# Patient Record
Sex: Female | Born: 1960 | Race: White | Hispanic: No | Marital: Single | Smoking: Current some day smoker
Health system: Southern US, Community
[De-identification: ages and names within clinical notes are randomized; demographics above are authoritative.]

---

## 1999-03-09 ENCOUNTER — Other Ambulatory Visit: Admission: RE | Admit: 1999-03-09 | Discharge: 1999-03-09 | Payer: Self-pay | Admitting: Gynecology

## 2000-04-19 ENCOUNTER — Other Ambulatory Visit: Admission: RE | Admit: 2000-04-19 | Discharge: 2000-04-19 | Payer: Self-pay | Admitting: Gynecology

## 2001-05-02 ENCOUNTER — Other Ambulatory Visit: Admission: RE | Admit: 2001-05-02 | Discharge: 2001-05-02 | Payer: Self-pay | Admitting: Gynecology

## 2002-11-13 ENCOUNTER — Other Ambulatory Visit: Admission: RE | Admit: 2002-11-13 | Discharge: 2002-11-13 | Payer: Self-pay | Admitting: Gynecology

## 2005-10-08 ENCOUNTER — Encounter: Admission: RE | Admit: 2005-10-08 | Discharge: 2005-10-08 | Payer: Self-pay | Admitting: Rheumatology

## 2007-01-18 ENCOUNTER — Encounter: Admission: RE | Admit: 2007-01-18 | Discharge: 2007-01-18 | Payer: Self-pay | Admitting: Orthopedic Surgery

## 2009-03-04 ENCOUNTER — Encounter: Admission: RE | Admit: 2009-03-04 | Discharge: 2009-03-04 | Payer: Self-pay | Admitting: Family Medicine

## 2016-04-16 ENCOUNTER — Other Ambulatory Visit: Payer: Self-pay | Admitting: Family Medicine

## 2016-04-17 ENCOUNTER — Other Ambulatory Visit: Payer: Self-pay | Admitting: Family Medicine

## 2016-04-17 DIAGNOSIS — E041 Nontoxic single thyroid nodule: Secondary | ICD-10-CM

## 2016-04-26 ENCOUNTER — Ambulatory Visit
Admission: RE | Admit: 2016-04-26 | Discharge: 2016-04-26 | Disposition: A | Discharge status: Home / Self Care | Payer: Managed Care, Other (non HMO) | Source: Ambulatory Visit | Attending: Family Medicine | Admitting: Family Medicine

## 2016-04-26 DIAGNOSIS — E041 Nontoxic single thyroid nodule: Secondary | ICD-10-CM

## 2019-05-29 ENCOUNTER — Other Ambulatory Visit: Payer: Self-pay | Admitting: Family Medicine

## 2019-05-29 DIAGNOSIS — Z1231 Encounter for screening mammogram for malignant neoplasm of breast: Secondary | ICD-10-CM

## 2019-05-29 DIAGNOSIS — E559 Vitamin D deficiency, unspecified: Secondary | ICD-10-CM

## 2019-05-29 DIAGNOSIS — M810 Age-related osteoporosis without current pathological fracture: Secondary | ICD-10-CM

## 2020-07-11 ENCOUNTER — Encounter (HOSPITAL_COMMUNITY): Payer: Self-pay | Admitting: Emergency Medicine

## 2020-07-11 ENCOUNTER — Emergency Department (HOSPITAL_COMMUNITY)
Admission: EM | Admit: 2020-07-11 | Discharge: 2020-07-12 | Disposition: A | Discharge status: Home / Self Care | Payer: Managed Care, Other (non HMO) | Attending: Emergency Medicine | Admitting: Emergency Medicine

## 2020-07-11 ENCOUNTER — Emergency Department (HOSPITAL_COMMUNITY): Payer: Managed Care, Other (non HMO)

## 2020-07-11 DIAGNOSIS — Y9389 Activity, other specified: Secondary | ICD-10-CM | POA: Diagnosis not present

## 2020-07-11 DIAGNOSIS — S01511A Laceration without foreign body of lip, initial encounter: Secondary | ICD-10-CM

## 2020-07-11 DIAGNOSIS — W19XXXA Unspecified fall, initial encounter: Secondary | ICD-10-CM

## 2020-07-11 DIAGNOSIS — Z23 Encounter for immunization: Secondary | ICD-10-CM | POA: Diagnosis not present

## 2020-07-11 DIAGNOSIS — Y929 Unspecified place or not applicable: Secondary | ICD-10-CM | POA: Diagnosis not present

## 2020-07-11 DIAGNOSIS — W010XXA Fall on same level from slipping, tripping and stumbling without subsequent striking against object, initial encounter: Secondary | ICD-10-CM | POA: Diagnosis not present

## 2020-07-11 DIAGNOSIS — S2002XA Contusion of left breast, initial encounter: Secondary | ICD-10-CM | POA: Insufficient documentation

## 2020-07-11 DIAGNOSIS — S50311A Abrasion of right elbow, initial encounter: Secondary | ICD-10-CM | POA: Insufficient documentation

## 2020-07-11 DIAGNOSIS — Y999 Unspecified external cause status: Secondary | ICD-10-CM | POA: Insufficient documentation

## 2020-07-11 DIAGNOSIS — R930 Abnormal findings on diagnostic imaging of skull and head, not elsewhere classified: Secondary | ICD-10-CM | POA: Diagnosis not present

## 2020-07-11 DIAGNOSIS — S025XXB Fracture of tooth (traumatic), initial encounter for open fracture: Secondary | ICD-10-CM | POA: Diagnosis not present

## 2020-07-11 DIAGNOSIS — S0993XA Unspecified injury of face, initial encounter: Secondary | ICD-10-CM | POA: Diagnosis present

## 2020-07-11 DIAGNOSIS — S300XXA Contusion of lower back and pelvis, initial encounter: Secondary | ICD-10-CM | POA: Insufficient documentation

## 2020-07-11 LAB — COMPREHENSIVE METABOLIC PANEL
ALT: 107 U/L — ABNORMAL HIGH (ref 0–44)
AST: 239 U/L — ABNORMAL HIGH (ref 15–41)
Albumin: 3.6 g/dL (ref 3.5–5.0)
Alkaline Phosphatase: 92 U/L (ref 38–126)
Anion gap: 12 (ref 5–15)
BUN: 6 mg/dL (ref 6–20)
CO2: 28 mmol/L (ref 22–32)
Calcium: 9.2 mg/dL (ref 8.9–10.3)
Chloride: 106 mmol/L (ref 98–111)
Creatinine, Ser: 0.53 mg/dL (ref 0.44–1.00)
GFR calc Af Amer: >60 mL/min (ref >60)
GFR calc non Af Amer: >60 mL/min (ref >60)
Glucose, Bld: 91 mg/dL (ref 70–99)
Potassium: 3.7 mmol/L (ref 3.5–5.1)
Sodium: 146 mmol/L — ABNORMAL HIGH (ref 135–145)
Total Bilirubin: 0.4 mg/dL (ref 0.3–1.2)
Total Protein: 6.8 g/dL (ref 6.5–8.1)

## 2020-07-11 LAB — CBC
HCT: 38.6 % (ref 36.0–46.0)
Hemoglobin: 12.5 g/dL (ref 12.0–15.0)
MCH: 33.6 pg (ref 26.0–34.0)
MCHC: 32.4 g/dL (ref 30.0–36.0)
MCV: 103.8 fL — ABNORMAL HIGH (ref 80.0–100.0)
Platelets: 220 10*3/uL (ref 150–400)
RBC: 3.72 MIL/uL — ABNORMAL LOW (ref 3.87–5.11)
RDW: 14.4 % (ref 11.5–15.5)
WBC: 4.2 10*3/uL (ref 4.0–10.5)
nRBC: 0 % (ref 0.0–0.2)

## 2020-07-11 LAB — PROTIME-INR
INR: 0.9 (ref 0.8–1.2)
Prothrombin Time: 11.7 seconds (ref 11.4–15.2)

## 2020-07-11 LAB — LACTIC ACID, PLASMA: Lactic Acid, Venous: 2 mmol/L (ref 0.5–1.9)

## 2020-07-11 LAB — ETHANOL: Alcohol, Ethyl (B): 359 mg/dL (ref <10)

## 2020-07-11 MED ORDER — TETANUS-DIPHTH-ACELL PERTUSSIS 5-2.5-18.5 LF-MCG/0.5 IM SUSP
0.5000 mL | Freq: Once | INTRAMUSCULAR | Status: AC
  Administered 2020-07-12: 0.5 mL via INTRAMUSCULAR
  Filled 2020-07-11: qty 0.5

## 2020-07-11 MED ORDER — LIDOCAINE-EPINEPHRINE 1 %-1:100000 IJ SOLN
10.0000 mL | Freq: Once | INTRAMUSCULAR | Status: DC
  Filled 2020-07-11: qty 1

## 2020-07-11 NOTE — ED Notes (Signed)
Pt in CT a this time, husband now at bedside

## 2020-07-11 NOTE — ED Triage Notes (Signed)
Pt transported from home by [facility], pt reports falling forward hitting floor with face causing avulsion to lower lip with Front right tooth broken others loose.  Pt found to have bruising to L breast, and back all in various stages of healing. ccollar in place .

## 2020-07-11 NOTE — ED Provider Notes (Signed)
[facility] EMERGENCY DEPARTMENT Provider Note   CSN: [id number] Arrival date & time: 07/11/20  2111     History Chief Complaint  Patient presents with   Yesenia Phillips is a 59 y.o. female past medical history of hypothyroidism, alcohol abuse presents the ED after mechanical fall at home.  The patient states that she had been drinking and tripped over the threshold, striking her face on the floor.  She denies associated headache but complains of pain to her face alone.  Notable deformity to the lip and teeth.  No interventions prior to arrival.  She reports that she has had several falls recently all in the setting of drinking.  Reports a total of 4 beers today which is typical for her..  The history is provided by the patient.  Fall This is a new problem. The current episode started less than 1 hour ago. The problem occurs every several days. The problem has not changed since onset.Pertinent negatives include no chest pain, no abdominal pain, no headaches and no shortness of breath. Associated symptoms comments: Facial pain. She has tried nothing for the symptoms.       No past medical history on file.  There are no problems to display for this patient.  OB History   No obstetric history on file.     No family history on file.  Social History   Tobacco Use   Smoking status: Not on file  Substance Use Topics   Alcohol use: Not on file   Drug use: Not on file    Home Medications Prior to Admission medications   Not on File    Allergies    Patient has no known allergies.  Review of Systems   Review of Systems  Constitutional: Negative for chills and fever.  HENT: Positive for dental problem and facial swelling. Negative for voice change.   Eyes: Negative for redness and visual disturbance.  Respiratory: Negative for cough and shortness of breath.   Cardiovascular: Negative for chest pain and palpitations.  Gastrointestinal:  Negative for abdominal pain and vomiting.  Genitourinary: Negative for difficulty urinating and dysuria.  Musculoskeletal: Negative for gait problem and joint swelling.  Skin: Positive for wound. Negative for rash.       Positive for bruising throughout.  Neurological: Negative for dizziness and headaches.  Psychiatric/Behavioral: Negative for confusion and suicidal ideas.    Physical Exam Updated Vital Signs BP (!) 149/89 (BP Location: Right Arm)    Pulse (!) 104    Temp 98.2 F (36.8 C) (Oral)    Resp 16    Ht 5\' 4"  (1.626 m)    Wt 47 kg    SpO2 93%    BMI 17.79 kg/m   Physical Exam Constitutional:      General: She is not in acute distress.    Comments: Grossly intoxicated  HENT:     Head: Normocephalic.     Comments: Full-thickness laceration through the lower lip, numerous Ellis class II fractures to the upper and lower incisors, no Ellis class III fractures    Mouth/Throat:     Mouth: Mucous membranes are moist.     Pharynx: Oropharynx is clear.  Eyes:     General: No scleral icterus.    Pupils: Pupils are equal, round, and reactive to light.  Cardiovascular:     Rate and Rhythm: Normal rate and regular rhythm.     Pulses: Normal pulses.  Pulmonary:  Effort: Pulmonary effort is normal. No respiratory distress.  Abdominal:     General: There is no distension.     Tenderness: There is no abdominal tenderness.  Musculoskeletal:        General: No tenderness or deformity.     Cervical back: Normal range of motion and neck supple.  Skin:    Comments: Bruising noted to the left breast, left posterior back, abrasion of the right elbow  Neurological:     General: No focal deficit present.     Mental Status: She is alert and oriented to person, place, and time.  Psychiatric:        Mood and Affect: Mood normal.        Behavior: Behavior normal.     ED Results / Procedures / Treatments   Labs (all labs ordered are listed, but only abnormal results are  displayed) Labs Reviewed  COMPREHENSIVE METABOLIC PANEL - Abnormal; Notable for the following components:      Result Value   Sodium 146 (*)    AST 239 (*)    ALT 107 (*)    All other components within normal limits  CBC - Abnormal; Notable for the following components:   RBC 3.72 (*)    MCV 103.8 (*)    All other components within normal limits  ETHANOL - Abnormal; Notable for the following components:   Alcohol, Ethyl (B) 359 (*)    All other components within normal limits  LACTIC ACID, PLASMA - Abnormal; Notable for the following components:   Lactic Acid, Venous 2.0 (*)    All other components within normal limits  PROTIME-INR  URINALYSIS, ROUTINE W REFLEX MICROSCOPIC  I-STAT CHEM 8, ED  SAMPLE TO BLOOD BANK    EKG None  Radiology DG Chest 1 View  Result Date: 07/11/2020 CLINICAL DATA:  Status post fall. EXAM: CHEST  1 VIEW COMPARISON:  April 16, 2016 FINDINGS: The heart size and mediastinal contours are within normal limits. Both lungs are clear. A chronic ninth left rib fracture deformity is seen. IMPRESSION: No active disease. Electronically Signed   By: Aram Candela M.D.   On: 07/11/2020 22:15   DG Pelvis 1-2 Views  Result Date: 07/11/2020 CLINICAL DATA:  Status post fall. EXAM: PELVIS - 1-2 VIEW COMPARISON:  None. FINDINGS: There is no evidence of pelvic fracture or diastasis. No pelvic bone lesions are seen. IMPRESSION: Negative. Electronically Signed   By: Aram Candela M.D.   On: 07/11/2020 22:13   CT HEAD WO CONTRAST  Result Date: 07/11/2020 CLINICAL DATA:  Fall striking the face causing avulsion to the lower lip and broken and loose teeth. EXAM: CT HEAD WITHOUT CONTRAST CT MAXILLOFACIAL WITHOUT CONTRAST CT CERVICAL SPINE WITHOUT CONTRAST TECHNIQUE: Multidetector CT imaging of the head, cervical spine, and maxillofacial structures were performed using the standard protocol without intravenous contrast. Multiplanar CT image reconstructions of the cervical  spine and maxillofacial structures were also generated. COMPARISON:  None. FINDINGS: CT HEAD FINDINGS Brain: No evidence of acute infarction, hemorrhage, hydrocephalus, extra-axial collection or mass lesion/mass effect. Diffuse cerebral atrophy. 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 Vascular: Intracranial arterial vascular calcifications are present. Skull: The calvarium appears intact. Other: None. CT MAXILLOFACIAL FINDINGS Osseous: The nasal bones, orbital rims, maxillary antral walls, maxilla, mandibles, temporomandibular joints, pterygoid plates, and zygomatic arches appear intact. No acute displaced fractures identified. Orbits: Globes and extraocular muscles appear intact and symmetrical. No periorbital or intraconal hematoma. Sinuses: Paranasal sinuses and mastoid air cells are clear. Soft  tissues: Soft tissue laceration of the upper lip with tiny radiopaque foreign bodies demonstrated. Small associated hematoma. CT CERVICAL SPINE FINDINGS Alignment: Normal alignment the cervical spine and posterior elements. C1-2 articulation appears intact. Skull base and vertebrae: Skull base appears intact. No vertebral compression deformities. No focal bone lesion or bone destruction. Bone cortex appears intact. No acute fractures identified. Soft tissues and spinal canal: No prevertebral soft tissue swelling. No abnormal paraspinal soft tissue mass or infiltration. Disc levels: Degenerative changes with narrowed disc spaces and endplate hypertrophic changes most prominent at C5-6 and C6-7 levels. Degenerative changes in the facet joints. Upper chest: Mild emphysematous changes in the upper lungs. Other: None. IMPRESSION: 1. No acute intracranial abnormalities. Diffuse cerebral atrophy. 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 2. Soft tissue laceration of the upper lip with tiny radiopaque foreign bodies demonstrated. Small associated hematoma. 3. No acute displaced orbital or facial fractures identified. 4. Normal  alignment of the cervical spine. Degenerative changes. No acute displaced fractures identified. Electronically Signed   By: Burman NievesWilliam  Stevens M.D.   On: 07/11/2020 22:10   CT CERVICAL SPINE WO CONTRAST  Result Date: 07/11/2020 CLINICAL DATA:  Fall striking the face causing avulsion to the lower lip and broken and loose teeth. EXAM: CT HEAD WITHOUT CONTRAST CT MAXILLOFACIAL WITHOUT CONTRAST CT CERVICAL SPINE WITHOUT CONTRAST TECHNIQUE: Multidetector CT imaging of the head, cervical spine, and maxillofacial structures were performed using the standard protocol without intravenous contrast. Multiplanar CT image reconstructions of the cervical spine and maxillofacial structures were also generated. COMPARISON:  None. FINDINGS: CT HEAD FINDINGS Brain: No evidence of acute infarction, hemorrhage, hydrocephalus, extra-axial collection or mass lesion/mass effect. Diffuse cerebral atrophy. 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 Vascular: Intracranial arterial vascular calcifications are present. Skull: The calvarium appears intact. Other: None. CT MAXILLOFACIAL FINDINGS Osseous: The nasal bones, orbital rims, maxillary antral walls, maxilla, mandibles, temporomandibular joints, pterygoid plates, and zygomatic arches appear intact. No acute displaced fractures identified. Orbits: Globes and extraocular muscles appear intact and symmetrical. No periorbital or intraconal hematoma. Sinuses: Paranasal sinuses and mastoid air cells are clear. Soft tissues: Soft tissue laceration of the upper lip with tiny radiopaque foreign bodies demonstrated. Small associated hematoma. CT CERVICAL SPINE FINDINGS Alignment: Normal alignment the cervical spine and posterior elements. C1-2 articulation appears intact. Skull base and vertebrae: Skull base appears intact. No vertebral compression deformities. No focal bone lesion or bone destruction. Bone cortex appears intact. No acute fractures identified. Soft tissues and spinal canal: No  prevertebral soft tissue swelling. No abnormal paraspinal soft tissue mass or infiltration. Disc levels: Degenerative changes with narrowed disc spaces and endplate hypertrophic changes most prominent at C5-6 and C6-7 levels. Degenerative changes in the facet joints. Upper chest: Mild emphysematous changes in the upper lungs. Other: None. IMPRESSION: 1. No acute intracranial abnormalities. Diffuse cerebral atrophy. 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 2. Soft tissue laceration of the upper lip with tiny radiopaque foreign bodies demonstrated. Small associated hematoma. 3. No acute displaced orbital or facial fractures identified. 4. Normal alignment of the cervical spine. Degenerative changes. No acute displaced fractures identified. Electronically Signed   By: Burman NievesWilliam  Stevens M.D.   On: 07/11/2020 22:10   CT MAXILLOFACIAL WO CONTRAST  Result Date: 07/11/2020 CLINICAL DATA:  Fall striking the face causing avulsion to the lower lip and broken and loose teeth. EXAM: CT HEAD WITHOUT CONTRAST CT MAXILLOFACIAL WITHOUT CONTRAST CT CERVICAL SPINE WITHOUT CONTRAST TECHNIQUE: Multidetector CT imaging of the head, cervical spine, and maxillofacial structures were performed  using the standard protocol without intravenous contrast. Multiplanar CT image reconstructions of the cervical spine and maxillofacial structures were also generated. COMPARISON:  None. FINDINGS: CT HEAD FINDINGS Brain: No evidence of acute infarction, hemorrhage, hydrocephalus, extra-axial collection or mass lesion/mass effect. Diffuse cerebral atrophy. 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 Vascular: Intracranial arterial vascular calcifications are present. Skull: The calvarium appears intact. Other: None. CT MAXILLOFACIAL FINDINGS Osseous: The nasal bones, orbital rims, maxillary antral walls, maxilla, mandibles, temporomandibular joints, pterygoid plates, and zygomatic arches appear intact. No acute displaced fractures identified. Orbits: Globes  and extraocular muscles appear intact and symmetrical. No periorbital or intraconal hematoma. Sinuses: Paranasal sinuses and mastoid air cells are clear. Soft tissues: Soft tissue laceration of the upper lip with tiny radiopaque foreign bodies demonstrated. Small associated hematoma. CT CERVICAL SPINE FINDINGS Alignment: Normal alignment the cervical spine and posterior elements. C1-2 articulation appears intact. Skull base and vertebrae: Skull base appears intact. No vertebral compression deformities. No focal bone lesion or bone destruction. Bone cortex appears intact. No acute fractures identified. Soft tissues and spinal canal: No prevertebral soft tissue swelling. No abnormal paraspinal soft tissue mass or infiltration. Disc levels: Degenerative changes with narrowed disc spaces and endplate hypertrophic changes most prominent at C5-6 and C6-7 levels. Degenerative changes in the facet joints. Upper chest: Mild emphysematous changes in the upper lungs. Other: None. IMPRESSION: 1. No acute intracranial abnormalities. Diffuse cerebral atrophy. 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 2. Soft tissue laceration of the upper lip with tiny radiopaque foreign bodies demonstrated. Small associated hematoma. 3. No acute displaced orbital or facial fractures identified. 4. Normal alignment of the cervical spine. Degenerative changes. No acute displaced fractures identified. Electronically Signed   By: Burman Nieves M.D.   On: 07/11/2020 22:10    Procedures .Marland KitchenLaceration Repair  Date/Time: 07/11/2020 11:27 PM Performed by: Loree Fee, MD Authorized by: Jacalyn Lefevre, MD   Consent:    Consent obtained:  Verbal   Consent given by:  Patient (Fiance)   Risks discussed:  Infection, need for additional repair, nerve damage, pain, poor cosmetic result, poor wound healing, retained foreign body, tendon damage and vascular damage   Alternatives discussed:  No treatment Anesthesia (see MAR for exact dosages):     Anesthesia method:  Local infiltration   Local anesthetic:  Lidocaine 2% WITH epi Laceration details:    Location:  Lip   Lip location:  Lower lip, full thickness   Vermilion border involved: no     Height of lip laceration:  More than half vertical height   Length (cm):  3 Repair type:    Repair type:  Intermediate Exploration:    Hemostasis achieved with:  Epinephrine   Wound extent: muscle damage     Contaminated: yes   Treatment:    Irrigation solution:  Sterile saline   Irrigation volume:  200   Irrigation method:  Syringe   Visualized foreign bodies/material removed: no (Thoroughly irrigated and explored with no evident FB)   Skin repair:    Repair method:  Sutures   Suture size:  4-0   Suture material:  Plain gut (Vicryl)   Suture technique:  Horizontal mattress and simple interrupted (1 single deep Vicryl suture for reapproximation of the muscle layer, 5 individual simple interrupted sutures for wound edge approximation and one horizontal mattress on the mucosal surface)   Number of sutures:  6 Approximation:    Approximation:  Close Post-procedure details:    Dressing:  Open (no dressing)   (including  critical care time)  Medications Ordered in ED Medications  Tdap (BOOSTRIX) injection 0.5 mL (has no administration in time range)  lidocaine-EPINEPHrine (XYLOCAINE W/EPI) 1 %-1:100000 (with pres) injection 10 mL (has no administration in time range)    ED Course  I have reviewed the triage vital signs and the nursing notes.  Pertinent labs & imaging results that were available during my care of the patient were reviewed by me and considered in my medical decision making (see chart for details).    MDM Rules/Calculators/A&P                          Possible differential diagnoses I considered included:  ICH, skull fracture, concussion, C-spine injury, thoracic trauma, abdominal trauma, spinal injury, fracture, neurovascular injury,  laceration  Workup/Thought Process:  Patient presents following mechanical fall in the setting of heavy alcohol use, presenting grossly intoxicated with obvious facial trauma  Primary survey performed with findings above  Secondary survey performed with findings above  Vitals, labs, EKGs, and imaging were reviewed by myself, and were significant for: Vitals:   07/11/20 2113 07/11/20 2120  BP:  (!) 149/89  Pulse:  (!) 104  Resp:  16  Temp:  98.2 F (36.8 C)  SpO2: 99% 93%    Trauma scans including CT head, CT face, CT C-spine indicated given poor history in the setting of intoxication and inability clear C-spine, significant for: No ICH or clinically significant head, face injury.  C-spine clear.  Cervical collar cleared with negative imaging and no tenderness to the cervical midline.  On thorough MSK exam, no areas of focal tenderness or deformity requiring plain films  Trauma labs including CBC, CMP, urinalysis, lactic acid, ethanol level indicated. Findings include: Slight lactic acidosis likely in the setting of alcohol use and traumatic injury, no suspicion for antibiotics at this time.  Ethanol level elevated at 360.  Tdap administered.  Laceration repaired as above  Patient's fianc at bedside is sober and able to provide safe transport home, expresses understanding with importance of follow-up with dentistry for dental fractures as soon as possible, counseled on resolvable nature of sutures but need for wound evaluation approximately a week.  Counseled on wound care.  At this time I feel patient stable for discharge home in the care of her fianc.  The plan for this patient was discussed with Dr. Particia Nearing who voiced agreement and who oversaw evaluation and treatment of this patient.   Final Clinical Impression(s) / ED Diagnoses Final diagnoses:  Fall  Open fracture of tooth, initial encounter  Lip laceration, initial encounter    Rx / DC Orders ED Discharge  Orders    None     Labs, studies and imaging reviewed by myself and considered in medical decision making if ordered. Imaging interpreted by radiology. Pt was discussed with my attending, Dr. Particia Nearing  Electronically signed by:  Christiane Ha Redding9/10/202111:27 PM       Loree Fee, MD 07/11/20 1610    Jacalyn Lefevre, MD 07/12/20 1507

## 2020-07-12 NOTE — ED Notes (Signed)
Discharge instructions discussed with pt. Pt verbalized understanding with no question at this time. Pt ambulatory at discharge

## 2020-07-18 ENCOUNTER — Ambulatory Visit: Payer: Self-pay | Admitting: *Deleted

## 2020-07-18 NOTE — Telephone Encounter (Signed)
Patient called agent to schedule appointment for follow up to ED visit at [facility]. Patient had fall and got stitches in her lip.Patient states her lip stitches are gone and she feels the cut is opened and infected. Patient disconnected call- attempted to call home and cell number- home# disconnected and cell number- VM full. Unable to reach patient.

## 2020-09-10 ENCOUNTER — Other Ambulatory Visit: Payer: Self-pay | Admitting: Family Medicine

## 2020-09-10 DIAGNOSIS — M810 Age-related osteoporosis without current pathological fracture: Secondary | ICD-10-CM

## 2021-11-15 IMAGING — DX DG CHEST 1V
1 series · 1 of 1 positions shown · non-contrast
Comparison: April 16, 2016

CLINICAL DATA: Status post fall.

EXAM:
CHEST  1 VIEW

[chest ap]
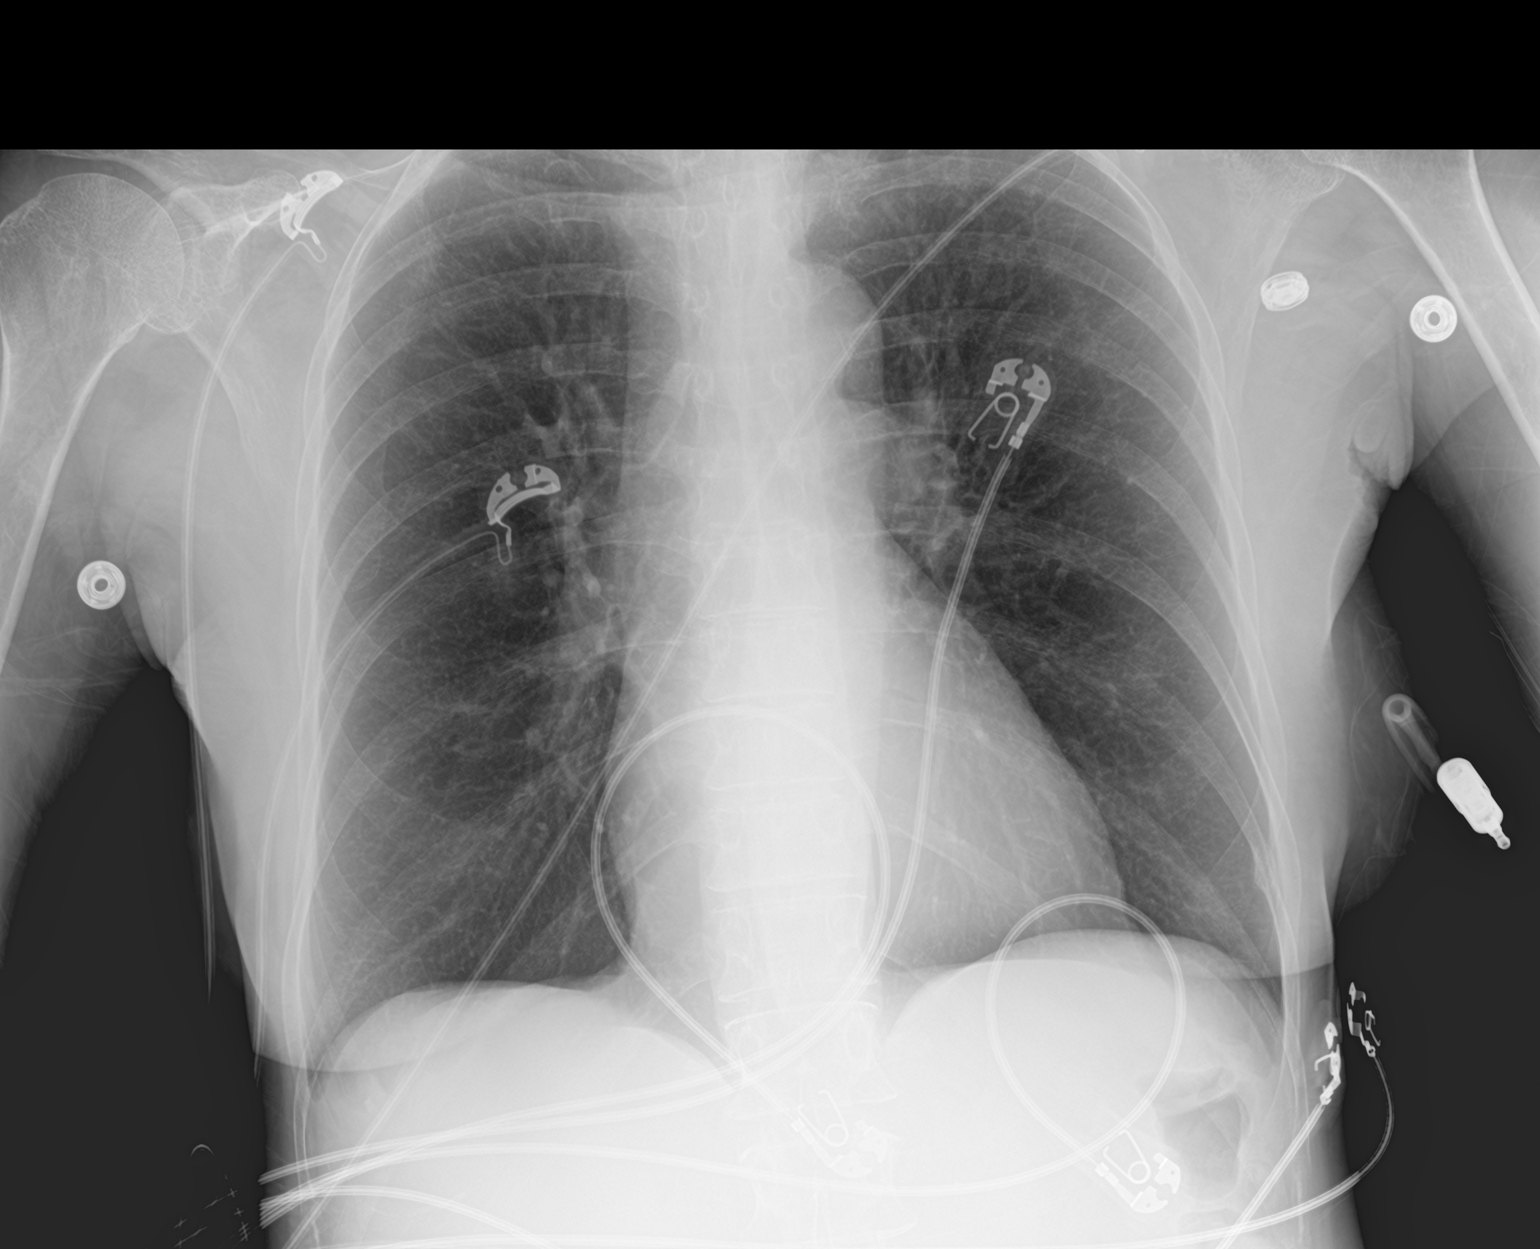

[1 of 1 positions shown; findings below may reference images not displayed]

FINDINGS: The heart size and mediastinal contours are within normal limits.
Both lungs are clear. A chronic ninth left rib fracture deformity is
seen.
IMPRESSION: No active disease.

## 2021-11-15 IMAGING — CT CT CERVICAL SPINE W/O CM
3 of 4 series · 13 of 33 positions shown, 16 images · non-contrast
Comparison: None.

CLINICAL DATA: Fall striking the face causing avulsion to the lower
lip and broken and loose teeth.

EXAM:
CT HEAD WITHOUT CONTRAST
CT MAXILLOFACIAL WITHOUT CONTRAST
CT CERVICAL SPINE WITHOUT CONTRAST
TECHNIQUE: Multidetector CT imaging of the head, cervical spine, and
maxillofacial structures were performed using the standard protocol
without intravenous contrast. Multiplanar CT image reconstructions
of the cervical spine and maxillofacial structures were also
generated.

[Series 8: sag bone · sagittal · 0.44mm/px · 5 of 74 slices shown, 6 images]
[im 25/74  bone]
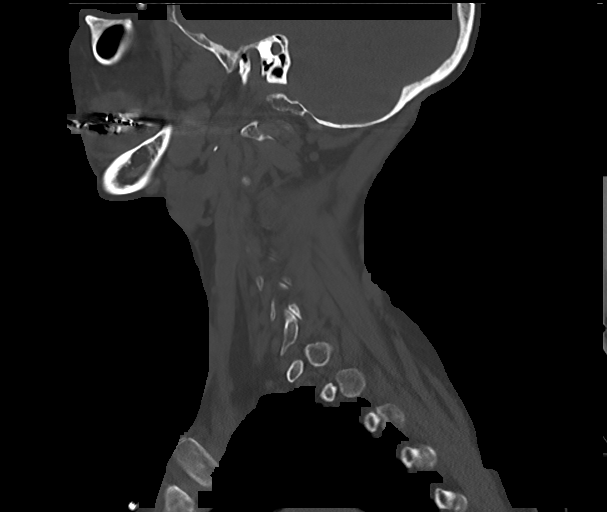
[im 31/74  bone]
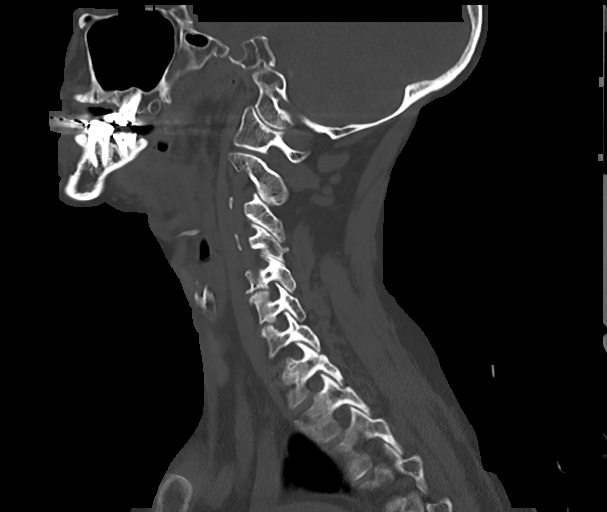
[im 37/74  soft-tissue]
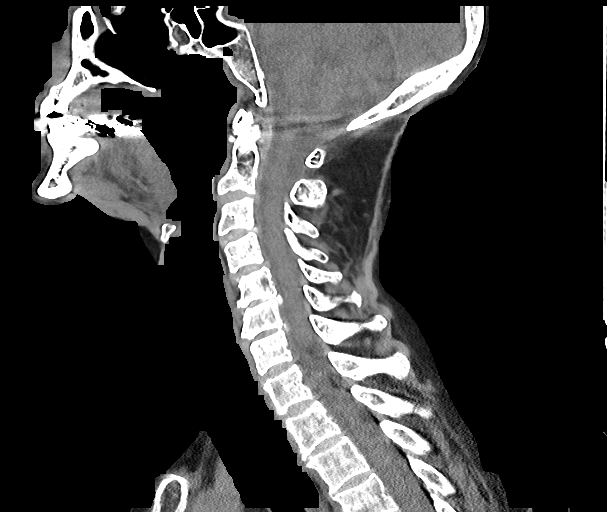
[im 37/74  bone]
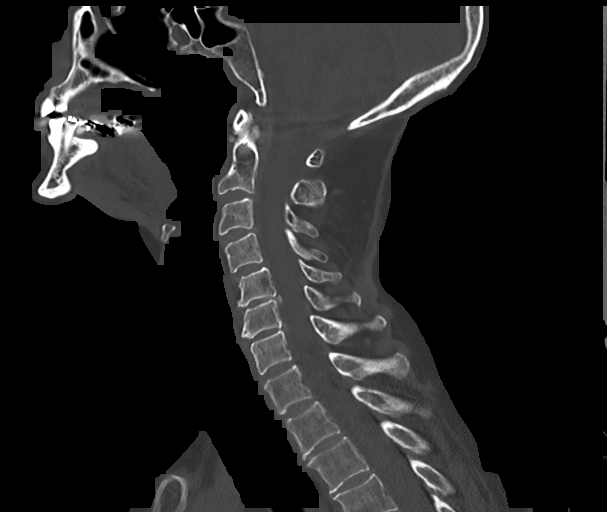
[im 43/74  bone]
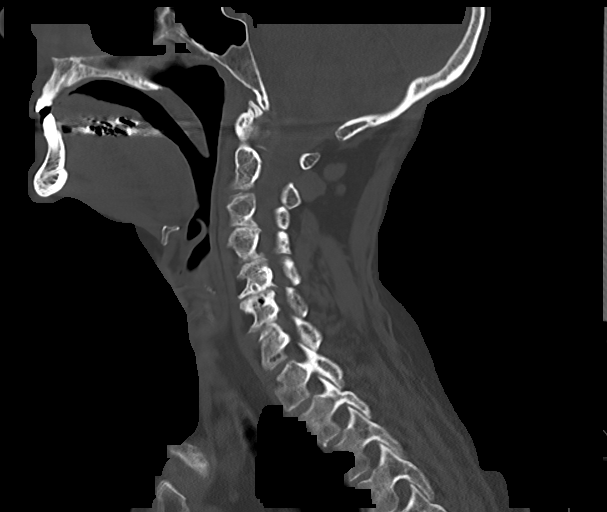
[im 49/74  bone]
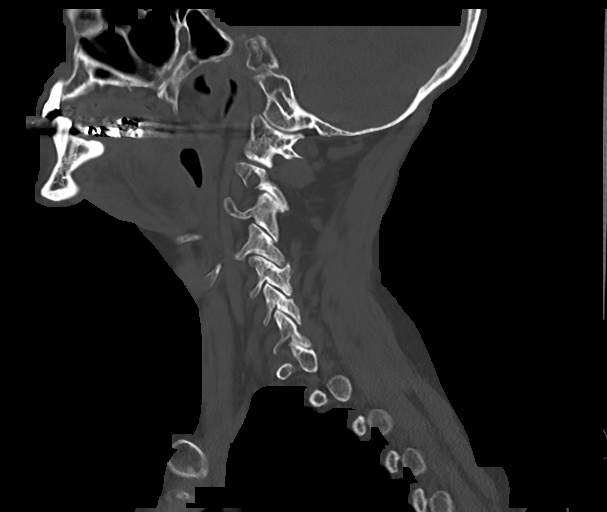

[Series 9: cor bone · coronal · 0.40mm/px · 3 of 134 slices shown]
[im 27/134  bone]
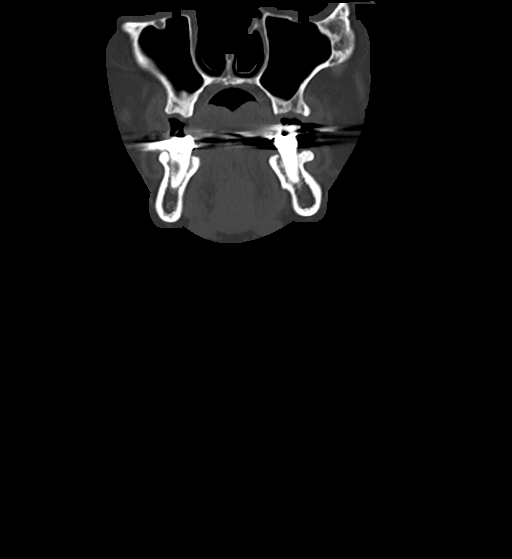
[im 54/134  bone]
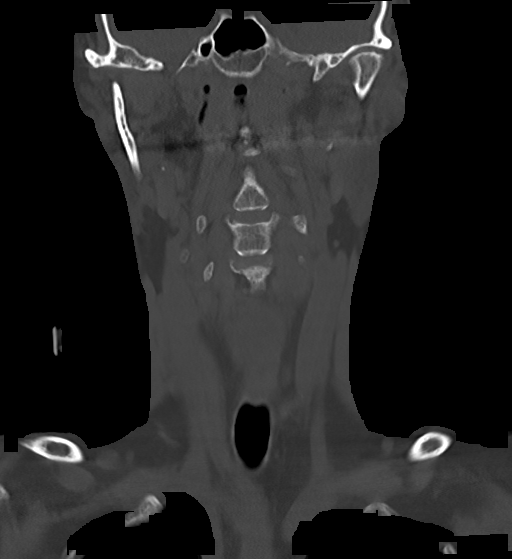
[im 80/134  bone]
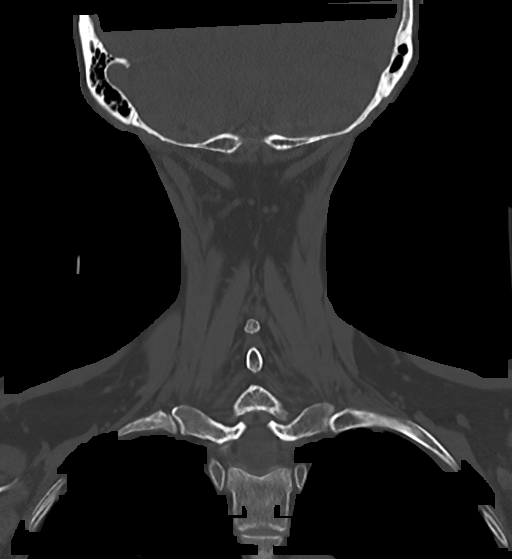

[Series 10: orthogonal axials · axial · 0.21mm/px · z∈[-274,-127]mm · 5 of 109 slices shown, 7 images]
[im 19/109  soft-tissue]
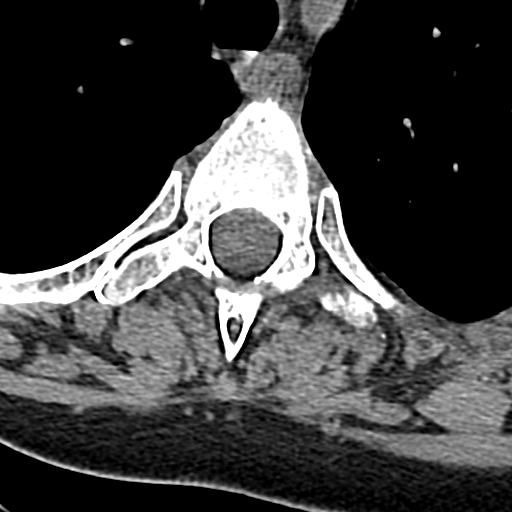
[im 19/109  bone]
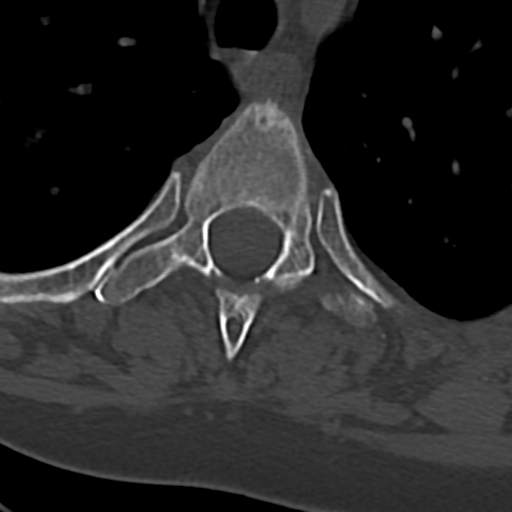
[im 37/109  bone]
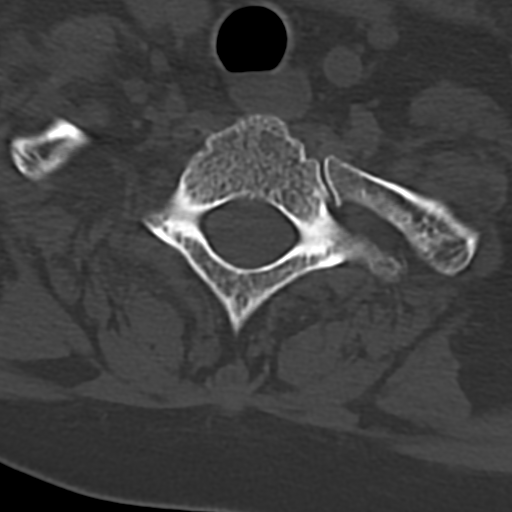
[im 55/109  bone]
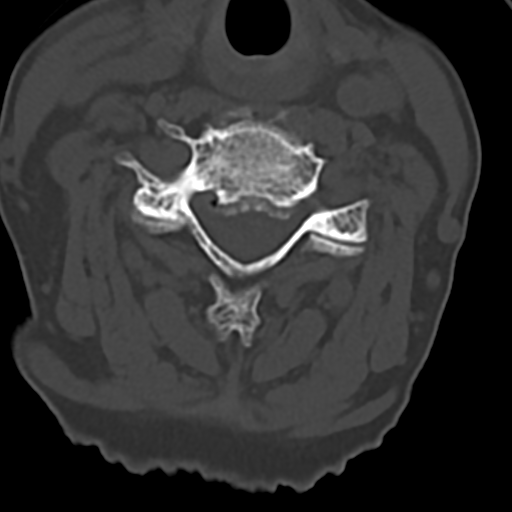
[im 73/109  bone]
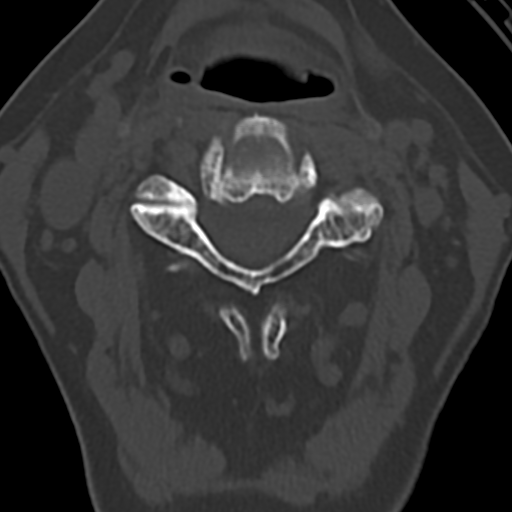
[im 91/109  soft-tissue]
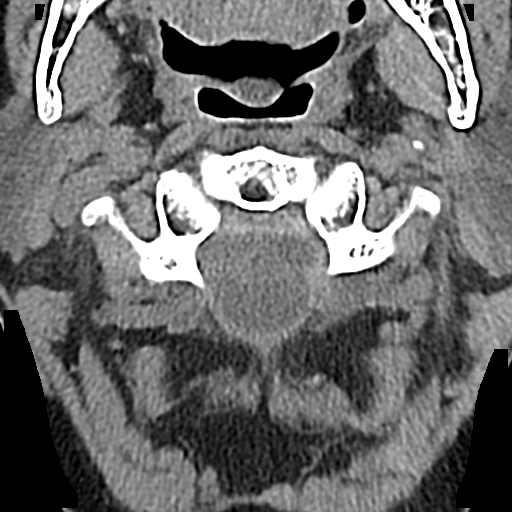
[im 91/109  bone]
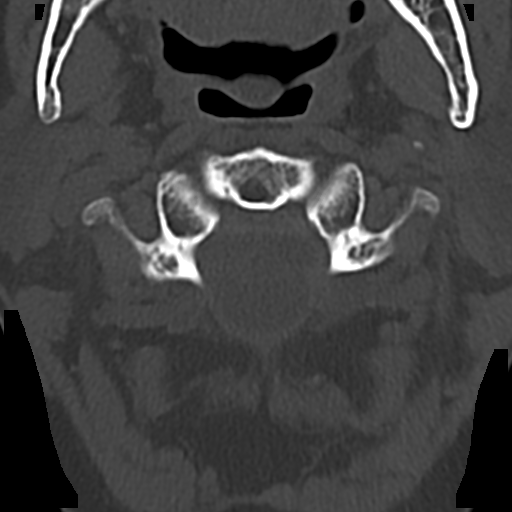

[13 of 33 positions shown; findings below may reference images not displayed]

FINDINGS: CT HEAD FINDINGS

Brain: No evidence of acute infarction, hemorrhage, hydrocephalus,
extra-axial collection or mass lesion/mass effect. Diffuse cerebral
atrophy. Old lacunar infarcts in the thalamus.

Vascular: Intracranial arterial vascular calcifications are present.

Skull: The calvarium appears intact.

Other: None.

CT MAXILLOFACIAL FINDINGS

Osseous: The nasal bones, orbital rims, maxillary antral walls,
maxilla, mandibles, temporomandibular joints, pterygoid plates, and
zygomatic arches appear intact. No acute displaced fractures
identified.

Orbits: Globes and extraocular muscles appear intact and
symmetrical. No periorbital or intraconal hematoma.

Sinuses: Paranasal sinuses and mastoid air cells are clear.

Soft tissues: Soft tissue laceration of the upper lip with tiny
radiopaque foreign bodies demonstrated. Small associated hematoma.

CT CERVICAL SPINE FINDINGS

Alignment: Normal alignment the cervical spine and posterior
elements. C1-2 articulation appears intact.

Skull base and vertebrae: Skull base appears intact. No vertebral
compression deformities. No focal bone lesion or bone destruction.
Bone cortex appears intact. No acute fractures identified.

Soft tissues and spinal canal: No prevertebral soft tissue swelling.
No abnormal paraspinal soft tissue mass or infiltration.

Disc levels: Degenerative changes with narrowed disc spaces and
endplate hypertrophic changes most prominent at C5-6 and C6-7
levels. Degenerative changes in the facet joints.

Upper chest: Mild emphysematous changes in the upper lungs.

Other: None.
IMPRESSION: 1. No acute intracranial abnormalities. Diffuse cerebral atrophy.
Old lacunar infarcts in the thalamus.
2. Soft tissue laceration of the upper lip with tiny radiopaque
foreign bodies demonstrated. Small associated hematoma.
3. No acute displaced orbital or facial fractures identified.
4. Normal alignment of the cervical spine. Degenerative changes. No
acute displaced fractures identified.

## 2021-11-15 IMAGING — CT CT HEAD W/O CM
4 series · 15 of 47 positions shown, 17 images · non-contrast
Comparison: None.

CLINICAL DATA: Fall striking the face causing avulsion to the lower
lip and broken and loose teeth.

EXAM:
CT HEAD WITHOUT CONTRAST
CT MAXILLOFACIAL WITHOUT CONTRAST
CT CERVICAL SPINE WITHOUT CONTRAST
TECHNIQUE: Multidetector CT imaging of the head, cervical spine, and
maxillofacial structures were performed using the standard protocol
without intravenous contrast. Multiplanar CT image reconstructions
of the cervical spine and maxillofacial structures were also
generated.

[Series 3: head wo · axial · 0.44mm/px · z∈[-104,+16]mm · 7 of 34 slices shown, 9 images]
[im 5/34  brain]
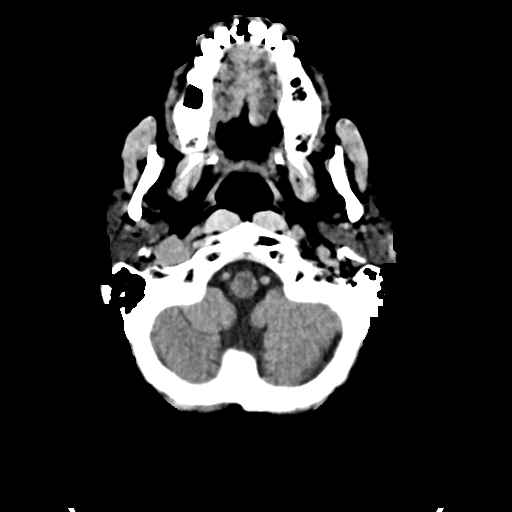
[im 5/34  bone]
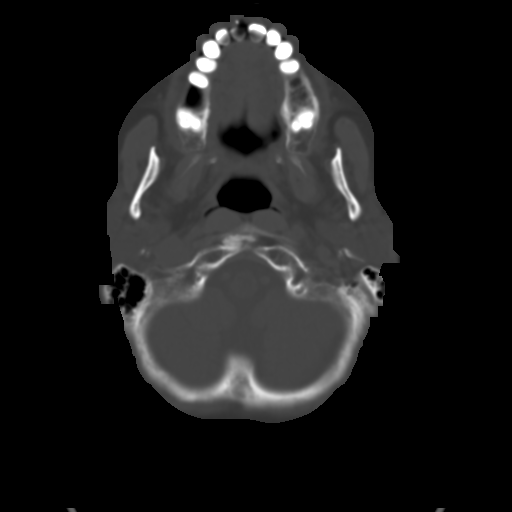
[im 9/34  brain]
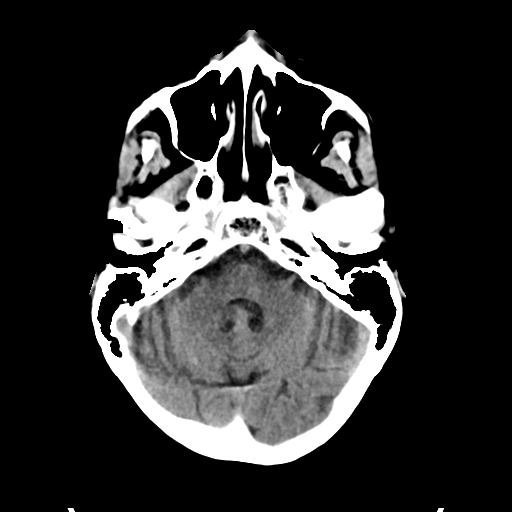
[im 13/34  brain]
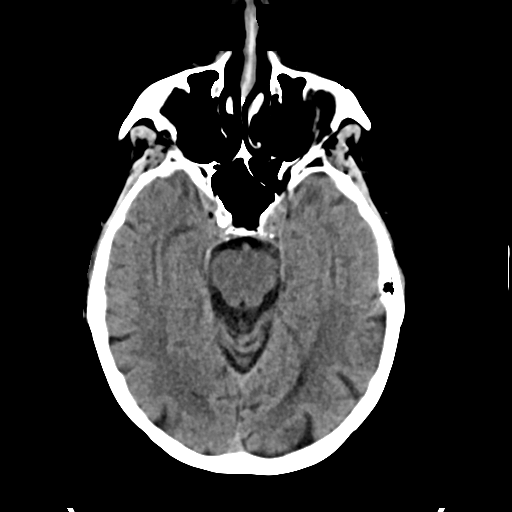
[im 17/34  brain]
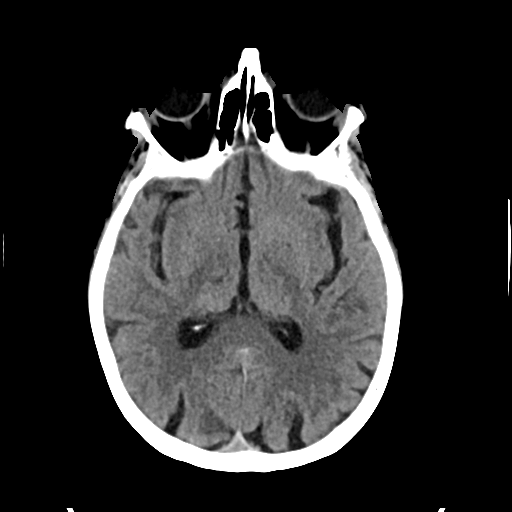
[im 21/34  brain]
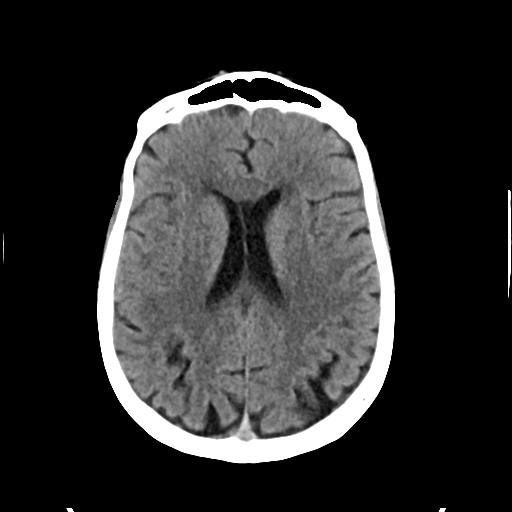
[im 21/34  bone]
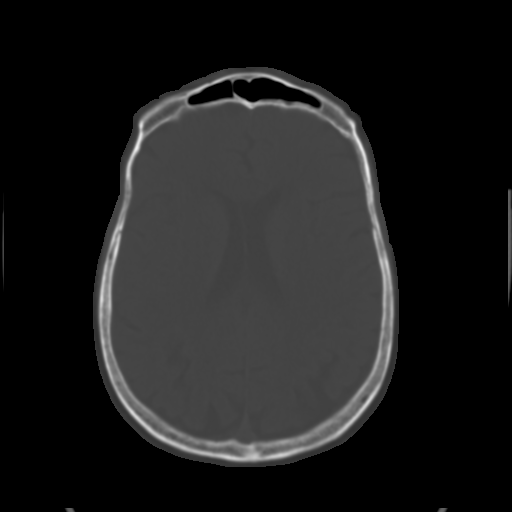
[im 25/34  brain]
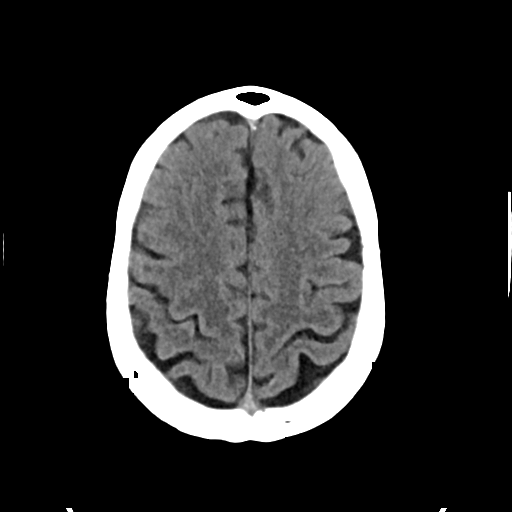
[im 29/34  brain]
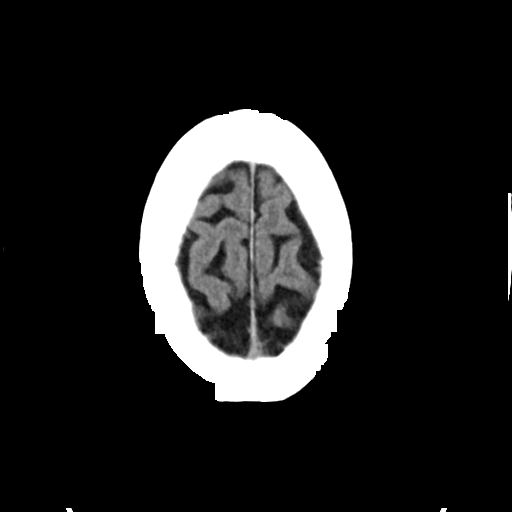

[Series 4: head bone · axial · 0.44mm/px · z∈[-108,-92]mm · 2 of 85 slices shown]
[im 9/85  bone]
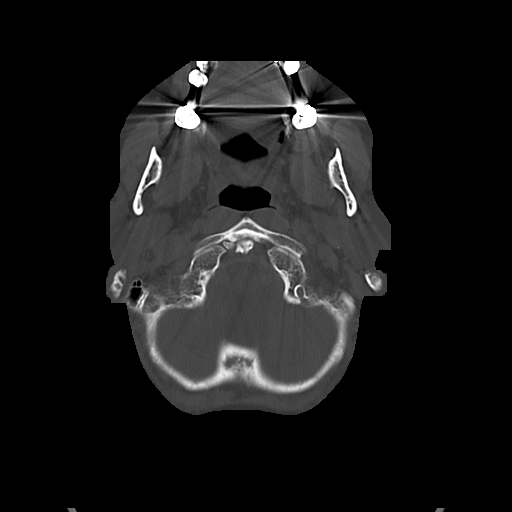
[im 17/85  bone]
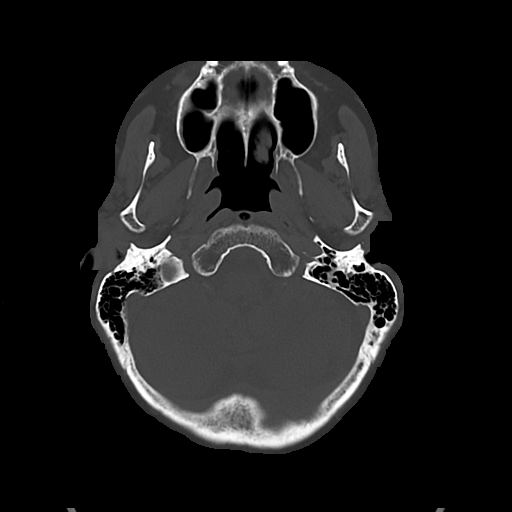

[Series 5: cor soft · coronal · 0.37mm/px · 3 of 76 slices shown]
[im 26/76  brain]
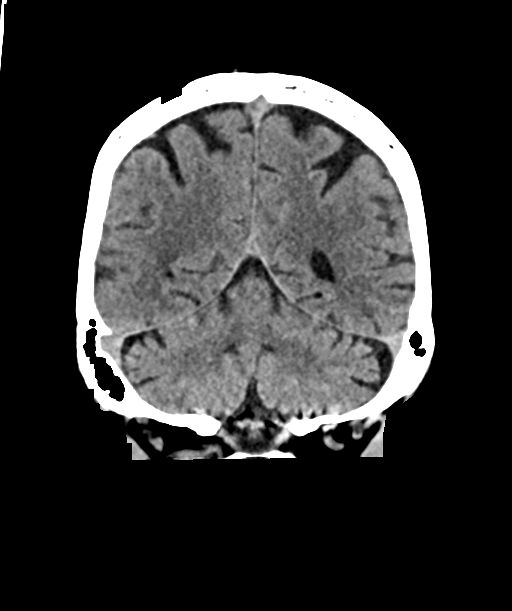
[im 34/76  brain]
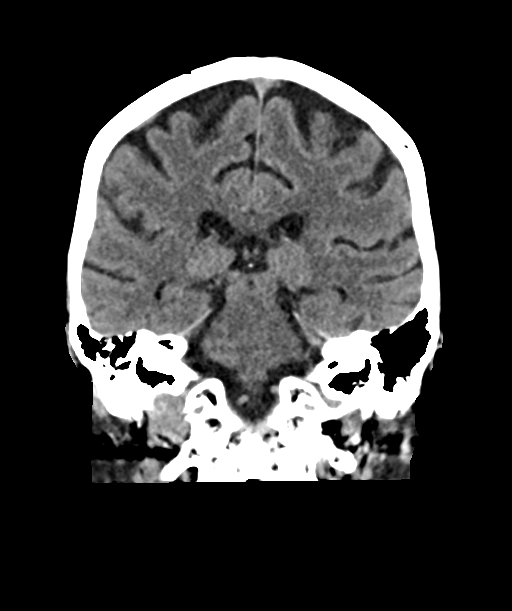
[im 42/76  brain]
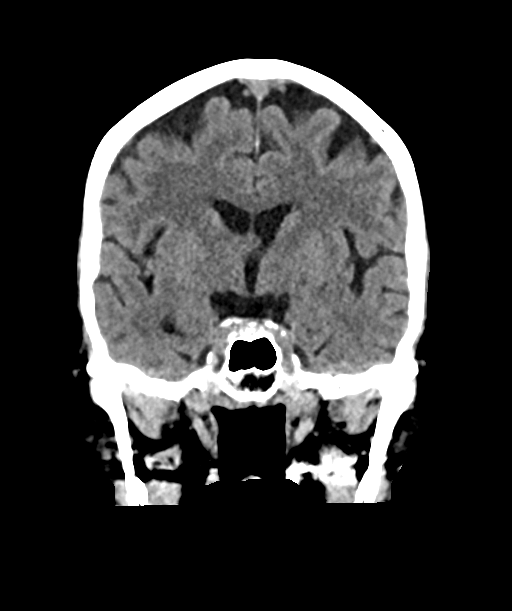

[Series 6: sag soft · sagittal · 0.44mm/px · 3 of 55 slices shown]
[im 19/55  brain]
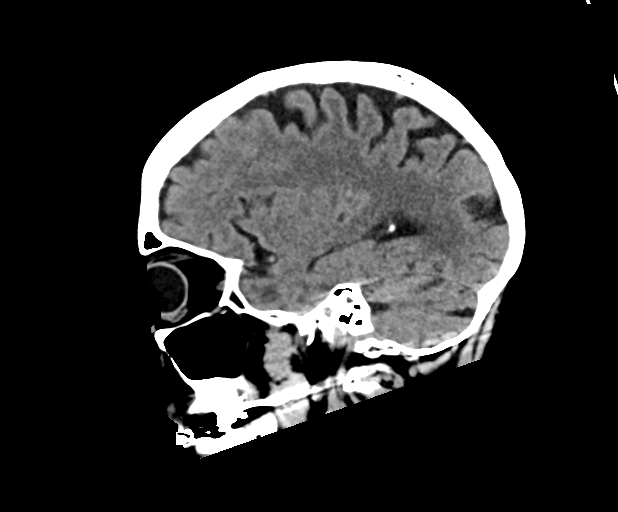
[im 28/55  brain]
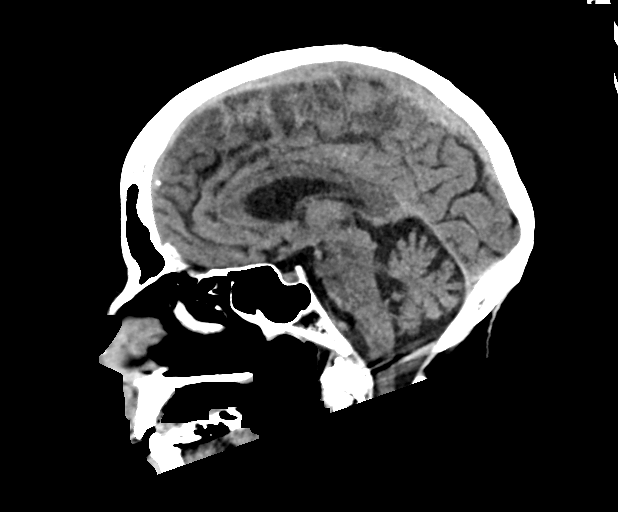
[im 37/55  brain]
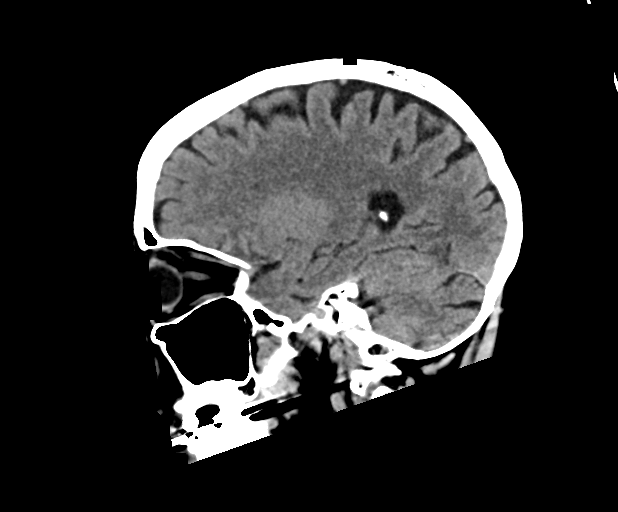

[15 of 47 positions shown; findings below may reference images not displayed]

FINDINGS: CT HEAD FINDINGS

Brain: No evidence of acute infarction, hemorrhage, hydrocephalus,
extra-axial collection or mass lesion/mass effect. Diffuse cerebral
atrophy. Old lacunar infarcts in the thalamus.

Vascular: Intracranial arterial vascular calcifications are present.

Skull: The calvarium appears intact.

Other: None.

CT MAXILLOFACIAL FINDINGS

Osseous: The nasal bones, orbital rims, maxillary antral walls,
maxilla, mandibles, temporomandibular joints, pterygoid plates, and
zygomatic arches appear intact. No acute displaced fractures
identified.

Orbits: Globes and extraocular muscles appear intact and
symmetrical. No periorbital or intraconal hematoma.

Sinuses: Paranasal sinuses and mastoid air cells are clear.

Soft tissues: Soft tissue laceration of the upper lip with tiny
radiopaque foreign bodies demonstrated. Small associated hematoma.

CT CERVICAL SPINE FINDINGS

Alignment: Normal alignment the cervical spine and posterior
elements. C1-2 articulation appears intact.

Skull base and vertebrae: Skull base appears intact. No vertebral
compression deformities. No focal bone lesion or bone destruction.
Bone cortex appears intact. No acute fractures identified.

Soft tissues and spinal canal: No prevertebral soft tissue swelling.
No abnormal paraspinal soft tissue mass or infiltration.

Disc levels: Degenerative changes with narrowed disc spaces and
endplate hypertrophic changes most prominent at C5-6 and C6-7
levels. Degenerative changes in the facet joints.

Upper chest: Mild emphysematous changes in the upper lungs.

Other: None.
IMPRESSION: 1. No acute intracranial abnormalities. Diffuse cerebral atrophy.
Old lacunar infarcts in the thalamus.
2. Soft tissue laceration of the upper lip with tiny radiopaque
foreign bodies demonstrated. Small associated hematoma.
3. No acute displaced orbital or facial fractures identified.
4. Normal alignment of the cervical spine. Degenerative changes. No
acute displaced fractures identified.

## 2023-07-26 DIAGNOSIS — M85859 Other specified disorders of bone density and structure, unspecified thigh: Secondary | ICD-10-CM | POA: Diagnosis not present

## 2023-07-26 DIAGNOSIS — E7800 Pure hypercholesterolemia, unspecified: Secondary | ICD-10-CM | POA: Diagnosis not present

## 2023-07-26 DIAGNOSIS — Z862 Personal history of diseases of the blood and blood-forming organs and certain disorders involving the immune mechanism: Secondary | ICD-10-CM | POA: Diagnosis not present

## 2023-07-27 ENCOUNTER — Other Ambulatory Visit: Payer: Self-pay | Admitting: Family Medicine

## 2023-07-27 DIAGNOSIS — F17200 Nicotine dependence, unspecified, uncomplicated: Secondary | ICD-10-CM

## 2023-07-29 ENCOUNTER — Other Ambulatory Visit: Payer: Self-pay | Admitting: Family Medicine

## 2023-07-29 DIAGNOSIS — M810 Age-related osteoporosis without current pathological fracture: Secondary | ICD-10-CM

## 2023-08-10 DIAGNOSIS — Z1211 Encounter for screening for malignant neoplasm of colon: Secondary | ICD-10-CM | POA: Diagnosis not present

## 2023-08-10 DIAGNOSIS — Z1212 Encounter for screening for malignant neoplasm of rectum: Secondary | ICD-10-CM | POA: Diagnosis not present

## 2023-11-01 DIAGNOSIS — I2510 Atherosclerotic heart disease of native coronary artery without angina pectoris: Secondary | ICD-10-CM | POA: Diagnosis not present

## 2023-11-01 DIAGNOSIS — R9082 White matter disease, unspecified: Secondary | ICD-10-CM | POA: Diagnosis not present

## 2023-11-01 DIAGNOSIS — R29715 NIHSS score 15: Secondary | ICD-10-CM | POA: Diagnosis not present

## 2023-11-01 DIAGNOSIS — R2681 Unsteadiness on feet: Secondary | ICD-10-CM | POA: Diagnosis not present

## 2023-11-01 DIAGNOSIS — I639 Cerebral infarction, unspecified: Secondary | ICD-10-CM | POA: Diagnosis not present

## 2023-11-01 DIAGNOSIS — C3490 Malignant neoplasm of unspecified part of unspecified bronchus or lung: Secondary | ICD-10-CM | POA: Diagnosis not present

## 2023-11-01 DIAGNOSIS — J439 Emphysema, unspecified: Secondary | ICD-10-CM | POA: Diagnosis not present

## 2023-11-02 DIAGNOSIS — J439 Emphysema, unspecified: Secondary | ICD-10-CM | POA: Diagnosis not present

## 2023-11-02 DIAGNOSIS — I2510 Atherosclerotic heart disease of native coronary artery without angina pectoris: Secondary | ICD-10-CM | POA: Diagnosis not present

## 2023-11-02 DIAGNOSIS — C3490 Malignant neoplasm of unspecified part of unspecified bronchus or lung: Secondary | ICD-10-CM | POA: Diagnosis not present

## 2023-11-15 DIAGNOSIS — F17200 Nicotine dependence, unspecified, uncomplicated: Secondary | ICD-10-CM | POA: Diagnosis not present

## 2023-11-15 DIAGNOSIS — J0100 Acute maxillary sinusitis, unspecified: Secondary | ICD-10-CM | POA: Diagnosis not present

## 2023-11-23 DIAGNOSIS — J984 Other disorders of lung: Secondary | ICD-10-CM | POA: Diagnosis not present

## 2023-11-23 DIAGNOSIS — I639 Cerebral infarction, unspecified: Secondary | ICD-10-CM | POA: Diagnosis not present

## 2023-11-23 DIAGNOSIS — F17200 Nicotine dependence, unspecified, uncomplicated: Secondary | ICD-10-CM | POA: Diagnosis not present

## 2023-11-24 ENCOUNTER — Other Ambulatory Visit: Payer: Self-pay | Admitting: Family Medicine

## 2023-11-24 DIAGNOSIS — J984 Other disorders of lung: Secondary | ICD-10-CM

## 2023-12-07 ENCOUNTER — Other Ambulatory Visit: Payer: Self-pay | Admitting: Emergency Medicine

## 2023-12-07 ENCOUNTER — Telehealth: Payer: Self-pay | Admitting: Acute Care

## 2023-12-07 DIAGNOSIS — F17210 Nicotine dependence, cigarettes, uncomplicated: Secondary | ICD-10-CM

## 2023-12-07 DIAGNOSIS — Z87891 Personal history of nicotine dependence: Secondary | ICD-10-CM

## 2023-12-07 DIAGNOSIS — Z122 Encounter for screening for malignant neoplasm of respiratory organs: Secondary | ICD-10-CM

## 2023-12-07 NOTE — Telephone Encounter (Signed)
 Lung Cancer Screening Narrative/Criteria Questionnaire (Cigarette Smokers Only- No Cigars/Pipes/vapes)   Yesenia Phillips   SDMV:12/21/2023 at 8:45am with [facility]        11/09/1960   LDCT: 12/22/2023 at 9:30 at [facility]    63 y.o.   Phone: [phone]  Lung Screening Narrative (confirm age 29-77 yrs Medicare / 50-80 yrs Private pay insurance)   Insurance information:[employer]   Referring Provider:Courtney Katina - PCP   This screening involves an initial phone call with a team member from our program. It is called a shared decision making visit. The initial meeting is required by  insurance and Medicare to make sure you understand the program. This appointment takes about 15-20 minutes to complete. You will complete the screening scan at your scheduled date/time.  This scan takes about 5-10 minutes to complete. You can eat and drink normally before and after the scan.  Criteria questions for Lung Cancer Screening:   Are you a current or former smoker? Current Age began smoking: 63yo   If you are a former smoker, what year did you quit smoking? N/A(within 15 yrs)   To calculate your smoking history, I need an accurate estimate of how many packs of cigarettes you smoked per day and for how many years. (Not just the number of PPD you are now smoking)   Years smoking 47 x Packs per day 1 = Pack years 47   (at least 20 pack yrs)   (Make sure they understand that we need to know how much they have smoked in the past, not just the number of PPD they are smoking now)  Do you have a personal history of cancer?  No    Do you have a family history of cancer? No  Are you coughing up blood?  No  Have you had unexplained weight loss of 15 lbs or more in the last 6 months? No  It looks like you meet all criteria.  When would be a good time for us  to schedule you for this screening?   Additional information: N/A

## 2023-12-21 ENCOUNTER — Encounter: Payer: Self-pay | Admitting: Adult Health

## 2023-12-21 ENCOUNTER — Ambulatory Visit (INDEPENDENT_AMBULATORY_CARE_PROVIDER_SITE_OTHER): Payer: 59 | Admitting: Adult Health

## 2023-12-21 ENCOUNTER — Encounter: Payer: 59 | Admitting: Adult Health

## 2023-12-21 DIAGNOSIS — F17210 Nicotine dependence, cigarettes, uncomplicated: Secondary | ICD-10-CM

## 2023-12-21 NOTE — Progress Notes (Signed)
  Virtual Visit via Telephone Note  I connected with Yesenia Phillips , 12/21/23 8:49 AM by a telemedicine application and verified that I am speaking with the correct person using two identifiers.  Location: Patient: home  Provider: home   I discussed the limitations of evaluation and management by telemedicine and the availability of in person appointments. The patient expressed understanding and agreed to proceed.   Shared Decision Making Visit Lung Cancer Screening Program [phone])   Eligibility: 63 y.o. Pack Years Smoking History Calculation =47 pack years (# packs/per year x # years smoked) Recent History of coughing up blood  no Unexplained weight loss? no ( >Than 15 pounds within the last 6 months ) Prior History Lung / other cancer no (Diagnosis within the last 5 years already requiring surveillance chest CT Scans). Smoking Status Current Smoker   Visit Components: Discussion included one or more decision making aids. YES Discussion included risk/benefits of screening. YES Discussion included potential follow up diagnostic testing for abnormal scans. YES Discussion included meaning and risk of over diagnosis. YES Discussion included meaning and risk of False Positives. YES Discussion included meaning of total radiation exposure. YES  Counseling Included: Importance of adherence to annual lung cancer LDCT screening. YES Impact of comorbidities on ability to participate in the program. YES Ability and willingness to under diagnostic treatment. YES  Smoking Cessation Counseling: Current Smokers:  Discussed importance of smoking cessation. yes Information about tobacco cessation classes and interventions provided to patient. yes Patient provided with "ticket" for LDCT Scan. yes Symptomatic Patient. NO Diagnosis Code: Tobacco Use Z72.0 Asymptomatic Patient yes  Counseling (Intermediate counseling: > three minutes counseling) [id number]   Z12.2-Screening of respiratory  organs Z87.891-Personal history of nicotine dependence   Danford Bad 12/21/23

## 2023-12-22 ENCOUNTER — Ambulatory Visit
Admission: RE | Admit: 2023-12-22 | Discharge: 2023-12-22 | Disposition: A | Discharge status: Home / Self Care | Payer: 59 | Source: Ambulatory Visit | Attending: Acute Care | Admitting: Acute Care

## 2023-12-22 DIAGNOSIS — F17210 Nicotine dependence, cigarettes, uncomplicated: Secondary | ICD-10-CM | POA: Diagnosis not present

## 2023-12-22 DIAGNOSIS — Z122 Encounter for screening for malignant neoplasm of respiratory organs: Secondary | ICD-10-CM

## 2023-12-22 DIAGNOSIS — Z87891 Personal history of nicotine dependence: Secondary | ICD-10-CM

## 2024-01-10 ENCOUNTER — Telehealth: Payer: Self-pay | Admitting: Acute Care

## 2024-01-10 DIAGNOSIS — Z87891 Personal history of nicotine dependence: Secondary | ICD-10-CM

## 2024-01-10 DIAGNOSIS — R911 Solitary pulmonary nodule: Secondary | ICD-10-CM

## 2024-01-10 NOTE — Telephone Encounter (Signed)
 Left voicemail for patient to call regarding lung screening CT results. Will need 3 month follow CT.

## 2024-01-10 NOTE — Telephone Encounter (Signed)
 Call report received:  IMPRESSION: 1. Lung-RADS 4A, suspicious. Follow up low-dose chest CT without contrast in 3 months (please use the following order, "CT CHEST LCS NODULE FOLLOW-UP W/O CM") is recommended. Apical left upper lobe solid 6.8 mm pulmonary nodule, new from 07/11/2020 cervical spine CT. 2. One vessel coronary atherosclerosis. 3. Left adrenal adenoma, for which no follow-up imaging is recommended. 4. Aortic Atherosclerosis (ICD10-I70.0) and Emphysema (ICD10-J43.9).

## 2024-01-10 NOTE — Addendum Note (Signed)
 Addended by: Abigail Miyamoto D on: 01/10/2024 03:30 PM   Modules accepted: Orders

## 2024-01-10 NOTE — Telephone Encounter (Signed)
 Called and spoke with patient and reviewed CT results. Small nodule noted that was not noted on Cervical spine CT in 2021. Plan is to repeat CT in 3 months. Pt verbalized understanding. Results/ plans faxed to PCP. Order placed for 3 month nodule f/u CT.

## 2024-02-13 ENCOUNTER — Encounter: Payer: Self-pay | Admitting: Neurology

## 2024-02-13 ENCOUNTER — Ambulatory Visit: Payer: 59 | Admitting: Neurology

## 2024-02-13 VITALS — BP 123/71 | HR 74 | Ht 64.0 in | Wt 109.8 lb

## 2024-02-13 DIAGNOSIS — R202 Paresthesia of skin: Secondary | ICD-10-CM

## 2024-02-13 DIAGNOSIS — I6381 Other cerebral infarction due to occlusion or stenosis of small artery: Secondary | ICD-10-CM | POA: Diagnosis not present

## 2024-02-13 DIAGNOSIS — G629 Polyneuropathy, unspecified: Secondary | ICD-10-CM

## 2024-02-13 MED ORDER — GABAPENTIN 100 MG PO CAPS: 100.0000 mg | ORAL_CAPSULE | Freq: Three times a day (TID) | ORAL | 1 refills | Status: DC

## 2024-02-13 NOTE — Progress Notes (Signed)
 [facility] [address] [place]. [place] [address] [phone]       OFFICE CONSULT NOTE  [name] Date of Birth:  [birth year] Medical Record Number:  [id number]   Referring MD: Jeri Cos, PA-C  Reason for Referral: Stroke  HPI: [name] is a 63 year old pleasant Caucasian lady seen today for initial office consultation visit for stroke.  History is obtained from the patient and review of referral notes and records in care everywhere. She has a past medical history of hyperlipidemia, hypothyroidism, cigarette smoking.  She was admitted to [facility] on 11/01/2023 with sudden onset of right facial drooping and right face and hand weakness.  CT head showed subacute left deep white matter lacunar infarct.  MRI scan confirmed a 17 mm acute infarct in the body of the left caudate.  CT angiogram of the head and neck were negative for large vessel stenosis or occlusion.  2D echo showed ejection fraction of 55 to 60%.  Left atrial size was normal.  Hemoglobin A1c was 5.7.  LDL cholesterol 56 mg percent.  Patient was started on dual antiplatelet therapy aspirin and Plavix for 3 weeks and then aspirin alone.  She had some mild dysphagia but did well on the modified barium swallow.  She was discharged with physical and occupational therapy as outpatient.  She had an incidental finding on a CT angio of head and neck for 8 mm spiculated density in the left lung apex which was new compared to CT pulmonary artery gram from December 2021.  Dedicated chest CT was obtained with contrast which noted eft apical pulmonary nodule 6.5 mm with emphysema.  Patient was referred to follow-up with pulmonary as an outpatient.  She states she is doing well.  She is not having any swallowing difficulties facial weakness.  She does have some diminished fine motor skills in the right hand.  She has had no recurrent stroke or TIA symptoms.  She is tolerating aspirin well without  bruising or bleeding.  She has not stopped smoking but knows she needs to do so.  She is tolerating Lipitor well without muscle aches and pains. Patient on inquiry also complains of having tingling numbness feeling in both feet for the year or so.  She is also described numbness.  She is this feeling is constant.  She was recently seen peripheral neuropathy clinic and has been treated with TENS unit as well as use of bright light which seems to be helping.  She is feels her balance is good.  She has had no episodes of tripping or falling down.  She has not had a formal evaluation for neuropathy in terms of lab work or all electrophysiological testing. ROS:   14 system review of systems is positive for tingling numbness facial weakness, hand weakness all other systems negative  PMH: History reviewed. No pertinent past medical history.  Social History:  Social History   Socioeconomic History   Marital status: Single    Spouse name: Not on file   Number of children: Not on file   Years of education: Not on file   Highest education level: Not on file  Occupational History   Not on file  Tobacco Use   Smoking status: Some Days    Types: Cigarettes   Smokeless tobacco: Never  Vaping Use   Vaping status: Never Used  Substance and Sexual Activity   Alcohol use: Not Currently   Drug use: Never   Sexual activity: Not Currently  Other Topics Concern   Not on file  Social History Narrative   Right Handed   2 Cups of Coffee per Day   1 Soda per Day   Social Drivers of Health   Financial Resource Strain: Not on file  Food Insecurity: Not on file  Transportation Needs: No Transportation Needs (11/02/2023)   Received from [facility] - Transportation    Lack of Transportation (Medical): No    Lack of Transportation (Non-Medical): No  Physical Activity: Not on file  Stress: Not on file  Social Connections: Unknown (03/14/2022)   Received from [facility]   Social Network     Social Network: Not on file  Intimate Partner Violence: Not At Risk (11/01/2023)   Received from [facility]   HITS    Over the last 12 months how often did your partner physically hurt you?: Never    Over the last 12 months how often did your partner insult you or talk down to you?: Never    Over the last 12 months how often did your partner threaten you with physical harm?: Never    Over the last 12 months how often did your partner scream or curse at you?: Never    Medications:   Current Outpatient Medications on File Prior to Visit  Medication Sig Dispense Refill   aspirin 81 MG chewable tablet Chew 81 mg by mouth daily.     atorvastatin (LIPITOR) 80 MG tablet Take 80 mg by mouth daily.     clopidogrel (PLAVIX) 75 MG tablet Take 75 mg by mouth daily.     ibuprofen (ADVIL) 800 MG tablet Take 800 mg by mouth every 6 (six) hours as needed.     levothyroxine (SYNTHROID) 75 MCG tablet Take 75 mcg by mouth every morning.     QUNOL COQ10/UBIQUINOL/MEGA PO Take 2 capsules by mouth daily.     rosuvastatin (CRESTOR) 10 MG tablet Take 10 mg by mouth daily.     traZODone (DESYREL) 100 MG tablet Take 100 mg by mouth at bedtime as needed.     No current facility-administered medications on file prior to visit.    Allergies:  No Known Allergies  Physical Exam General: well developed, well nourished, seated, in no evident distress Head: head normocephalic and atraumatic.   Neck: supple with no carotid or supraclavicular bruits Cardiovascular: regular rate and rhythm, no murmurs Musculoskeletal: no deformity Skin:  no rash/petichiae Vascular:  Normal pulses all extremities  Neurologic Exam Mental Status: Awake and fully alert. Oriented to place and time. Recent and remote memory intact. Attention span, concentration and fund of knowledge appropriate. Mood and affect appropriate.  Cranial Nerves: Fundoscopic exam reveals sharp disc margins. Pupils equal, briskly reactive to light.  Extraocular movements full without nystagmus. Visual fields full to confrontation. Hearing intact. Facial sensation intact.  Right lower facial asymmetry.  Tongue, palate moves normally and symmetrically.  Motor: Normal bulk and tone. Normal strength in all tested extremity muscles.  Mild weakness of right grip and intrinsic hand muscles.  Diminished fine finger movements on the right.  Orbits left over right upper extremity. Sensory.: intact to touch , pinprick , but impaired position and vibratory sensation on toes bilaterally.  Weakly positive Romberg test..  Coordination: Rapid alternating movements normal in all extremities. Finger-to-nose and heel-to-shin performed accurately bilaterally. Gait and Station: Arises from chair without difficulty. Stance is normal. Gait demonstrates normal stride length and balance . Able to heel, toe and tandem walk with  moderate difficulty.  Reflexes: 1+ and symmetric. Toes downgoing.   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  Vascular risk factors of smoking and hyperlipidemia.  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     PLAN:I had a long d/w patient about his recent lacunar stroke and lower extremity paresthesias, risk for recurrent stroke/TIAs, personally independently reviewed imaging studies and stroke evaluation results and answered questions.Continue aspirin 81 mg daily  for secondary stroke prevention and maintain strict control of hypertension with blood pressure goal below 130/90, diabetes with hemoglobin A1c goal below 6.5% and lipids with LDL cholesterol goal below 70 mg/dL. I also advised the patient to eat a healthy diet with plenty of whole grains, cereals, fruits and vegetables, exercise regularly and maintain ideal body weight .I recommend outpatient physical and Occupational  Therapy.  I have counseled the patient to quit smoking completely and she said she will try.  I advised her to get help from primary care physician for the same.  Check neuropathy panel labs and nerve conduction study.  Trial of gabapentin 100 mg twice daily for her paresthesias and increase if tolerated after 1 week to 3 times daily.  She will return for follow-up in the future in 6 months or call earlier if necessary.   Greater than 50% time during this prolonged 60-minute consultation was spent on counseling and coordination of care about her lacunar stroke as well as neuropathy and discussion about evaluation and treatment plan and answering questions. Ardella Beaver, MD  Note: This document was prepared with digital dictation and possible smart phrase technology. Any transcriptional errors that result from this process are unintentional.

## 2024-02-13 NOTE — Patient Instructions (Signed)
 I had a long d/w patient about his recent lacunar stroke and lower extremity paresthesias, risk for recurrent stroke/TIAs, personally independently reviewed imaging studies and stroke evaluation results and answered questions.Continue aspirin 81 mg daily  for secondary stroke prevention and maintain strict control of hypertension with blood pressure goal below 130/90, diabetes with hemoglobin A1c goal below 6.5% and lipids with LDL cholesterol goal below 70 mg/dL. I also advised the patient to eat a healthy diet with plenty of whole grains, cereals, fruits and vegetables, exercise regularly and maintain ideal body weight .I recommend outpatient physical and Occupational Therapy.  I have counseled the patient to quit smoking completely and she said she will try.  I advised her to get help from primary care physician for the same.  Check neuropathy panel labs and nerve conduction study.  Trial of gabapentin 100 mg twice daily for her paresthesias and increase if tolerated after 1 week to 3 times daily.  She will return for follow-up in the future in 6 months or call earlier if necessary.     Stroke Prevention Some medical conditions and behaviors can lead to a higher chance of having a stroke. You can help prevent a stroke by eating healthy, exercising, not smoking, and managing any medical conditions you have. Stroke is a leading cause of functional impairment. Primary prevention is particularly important because a majority of strokes are first-time events. Stroke changes the lives of not only those who experience a stroke but also their family and other caregivers. How can this condition affect me? A stroke is a medical emergency and should be treated right away. A stroke can lead to brain damage and can sometimes be life-threatening. If a person gets medical treatment right away, there is a better chance of surviving and recovering from a stroke. What can increase my risk? The following medical conditions  may increase your risk of a stroke: Cardiovascular disease. High blood pressure (hypertension). Diabetes. High cholesterol. Sickle cell disease. Blood clotting disorders (hypercoagulable state). Obesity. Sleep disorders (obstructive sleep apnea). Other risk factors include: Being older than age 82. Having a history of blood clots, stroke, or mini-stroke (transient ischemic attack, TIA). Genetic factors, such as race, ethnicity, or a family history of stroke. Smoking cigarettes or using other tobacco products. Taking birth control pills, especially if you also use tobacco. Heavy use of alcohol or drugs, especially cocaine and methamphetamine. Physical inactivity. What actions can I take to prevent this? Manage your health conditions High cholesterol levels. Eating a healthy diet is important for preventing high cholesterol. If cholesterol cannot be managed through diet alone, you may need to take medicines. Take any prescribed medicines to control your cholesterol as told by your health care provider. Hypertension. To reduce your risk of stroke, try to keep your blood pressure below 130/80. Eating a healthy diet and exercising regularly are important for controlling blood pressure. If these steps are not enough to manage your blood pressure, you may need to take medicines. Take any prescribed medicines to control hypertension as told by your health care provider. Ask your health care provider if you should monitor your blood pressure at home. Have your blood pressure checked every year, even if your blood pressure is normal. Blood pressure increases with age and some medical conditions. Diabetes. Eating a healthy diet and exercising regularly are important parts of managing your blood sugar (glucose). If your blood sugar cannot be managed through diet and exercise, you may need to take medicines. Take any prescribed medicines  to control your diabetes as told by your health care  provider. Get evaluated for obstructive sleep apnea. Talk to your health care provider about getting a sleep evaluation if you snore a lot or have excessive sleepiness. Make sure that any other medical conditions you have, such as atrial fibrillation or atherosclerosis, are managed. Nutrition Follow instructions from your health care provider about what to eat or drink to help manage your health condition. These instructions may include: Reducing your daily calorie intake. Limiting how much salt (sodium) you use to 1,500 milligrams (mg) each day. Using only healthy fats for cooking, such as olive oil, canola oil, or sunflower oil. Eating healthy foods. You can do this by: Choosing foods that are high in fiber, such as whole grains, and fresh fruits and vegetables. Eating at least 5 servings of fruits and vegetables a day. Try to fill one-half of your plate with fruits and vegetables at each meal. Choosing lean protein foods, such as lean cuts of meat, poultry without skin, fish, tofu, beans, and nuts. Eating low-fat dairy products. Avoiding foods that are high in sodium. This can help lower blood pressure. Avoiding foods that have saturated fat, trans fat, and cholesterol. This can help prevent high cholesterol. Avoiding processed and prepared foods. Counting your daily carbohydrate intake.  Lifestyle If you drink alcohol: Limit how much you have to: 0-1 drink a day for women who are not pregnant. 0-2 drinks a day for men. Know how much alcohol is in your drink. In the [place] one drink equals one 12 oz bottle of beer ( ), one 5 oz glass of wine ( ), or one 1 oz glass of hard liquor (44mL). Do not use any products that contain nicotine or tobacco. These products include cigarettes, chewing tobacco, and vaping devices, such as e-cigarettes. If you need help quitting, ask your health care provider. Avoid secondhand smoke. Do not use drugs. Activity  Try to stay at a healthy  weight. Get at least 30 minutes of exercise on most days, such as: Fast walking. Biking. Swimming. Medicines Take over-the-counter and prescription medicines only as told by your health care provider. Aspirin or blood thinners (antiplatelets or anticoagulants) may be recommended to reduce your risk of forming blood clots that can lead to stroke. Avoid taking birth control pills. Talk to your health care provider about the risks of taking birth control pills if: You are over 34 years old. You smoke. You get very bad headaches. You have had a blood clot. Where to find more information American Stroke Association: www.strokeassociation.org Get help right away if: You or a loved one has any symptoms of a stroke. "BE FAST" is an easy way to remember the main warning signs of a stroke: B - Balance. Signs are dizziness, sudden trouble walking, or loss of balance. E - Eyes. Signs are trouble seeing or a sudden change in vision. F - Face. Signs are sudden weakness or numbness of the face, or the face or eyelid drooping on one side. A - Arms. Signs are weakness or numbness in an arm. This happens suddenly and usually on one side of the body. S - Speech. Signs are sudden trouble speaking, slurred speech, or trouble understanding what people say. T - Time. Time to call emergency services. Write down what time symptoms started. You or a loved one has other signs of a stroke, such as: A sudden, severe headache with no known cause. Nausea or vomiting. Seizure. These symptoms may represent a serious problem that  is an emergency. Do not wait to see if the symptoms will go away. Get medical help right away. Call your local emergency services (911 in the [place] Do not drive yourself to the hospital. Summary You can help to prevent a stroke by eating healthy, exercising, not smoking, limiting alcohol intake, and managing any medical conditions you may have. Do not use any products that contain nicotine or  tobacco. These include cigarettes, chewing tobacco, and vaping devices, such as e-cigarettes. If you need help quitting, ask your health care provider. Remember "BE FAST" for warning signs of a stroke. Get help right away if you or a loved one has any of these signs. This information is not intended to replace advice given to you by your health care provider. Make sure you discuss any questions you have with your health care provider. Document Revised: 09/20/2022 Document Reviewed: 09/20/2022 Elsevier Patient Education  2024 [employer].

## 2024-02-15 LAB — NEUROPATHY PANEL
A/G Ratio: 1.4 (ref 0.7–1.7)
Albumin ELP: 4.2 g/dL (ref 2.9–4.4)
Alpha 1: 0.3 g/dL (ref 0.0–0.4)
Alpha 2: 1 g/dL (ref 0.4–1.0)
Angio Convert Enzyme: 44 U/L (ref 14–82)
Anti Nuclear Antibody (ANA): NEGATIVE
Beta: 0.9 g/dL (ref 0.7–1.3)
Gamma Globulin: 1 g/dL (ref 0.4–1.8)
Globulin, Total: 3.1 g/dL (ref 2.2–3.9)
Rheumatoid fact SerPl-aCnc: <10 [IU]/mL (ref <14.0)
Sed Rate: 6 mm/h (ref 0–40)
TSH: 1.19 u[IU]/mL (ref 0.450–4.500)
Total Protein: 7.3 g/dL (ref 6.0–8.5)
Vit D, 25-Hydroxy: 52.8 ng/mL (ref 30.0–100.0)
Vitamin B-12: 1028 pg/mL (ref 232–1245)

## 2024-02-19 NOTE — Progress Notes (Signed)
 Kindly inform the patient that lab work for reversible causes of neuropathy was all satisfactory.

## 2024-02-22 ENCOUNTER — Telehealth: Payer: Self-pay | Admitting: Neurology

## 2024-02-22 NOTE — Telephone Encounter (Signed)
 Called the pt to discuss lab results. There was no answer. LVM advising the pt that labs looking at neuropathy came back good and there was nothing concerning. Advised the patient to call back with any questions and a mychart message will also be sent.

## 2024-02-22 NOTE — Telephone Encounter (Signed)
-----   Message from Ardella Beaver sent at 02/19/2024  1:00 PM EDT ----- Yesenia Phillips inform the patient that lab work for reversible causes of neuropathy was all satisfactory.

## 2024-03-14 ENCOUNTER — Other Ambulatory Visit: Payer: Self-pay | Admitting: Family Medicine

## 2024-03-14 DIAGNOSIS — Z1231 Encounter for screening mammogram for malignant neoplasm of breast: Secondary | ICD-10-CM

## 2024-03-21 ENCOUNTER — Ambulatory Visit
Admission: RE | Admit: 2024-03-21 | Discharge: 2024-03-21 | Disposition: A | Discharge status: Home / Self Care | Source: Ambulatory Visit | Attending: Family Medicine | Admitting: Family Medicine

## 2024-03-21 DIAGNOSIS — Z1231 Encounter for screening mammogram for malignant neoplasm of breast: Secondary | ICD-10-CM | POA: Diagnosis not present

## 2024-04-08 ENCOUNTER — Other Ambulatory Visit: Payer: Self-pay | Admitting: Neurology

## 2024-04-27 ENCOUNTER — Inpatient Hospital Stay: Admission: RE | Admit: 2024-04-27 | Source: Ambulatory Visit

## 2024-04-30 ENCOUNTER — Encounter: Payer: Self-pay | Admitting: Emergency Medicine

## 2024-04-30 ENCOUNTER — Telehealth: Payer: Self-pay | Admitting: Acute Care

## 2024-04-30 NOTE — Telephone Encounter (Signed)
 Patient was scheduled for a 3 month follow up scan for 04/27/2024, patient no-showed. Called and left VM for pt. Letter printed and mailed to patient.

## 2024-05-02 NOTE — Telephone Encounter (Signed)
 Patient called and left VM to reschedule CT. Attempted to contact patient but had to leave another message for patient to call to reschedule.

## 2024-05-02 NOTE — Telephone Encounter (Signed)
 Patient called back. Patient scheduled for LDCT on 05/07/2024 at 9:40am at [facility]

## 2024-05-07 ENCOUNTER — Ambulatory Visit
Admission: RE | Admit: 2024-05-07 | Discharge: 2024-05-07 | Disposition: A | Discharge status: Home / Self Care | Source: Ambulatory Visit | Attending: Acute Care | Admitting: Acute Care

## 2024-05-07 DIAGNOSIS — R911 Solitary pulmonary nodule: Secondary | ICD-10-CM

## 2024-05-07 DIAGNOSIS — Z87891 Personal history of nicotine dependence: Secondary | ICD-10-CM

## 2024-05-22 ENCOUNTER — Telehealth: Payer: Self-pay

## 2024-05-22 ENCOUNTER — Other Ambulatory Visit: Payer: Self-pay

## 2024-05-22 DIAGNOSIS — Z87891 Personal history of nicotine dependence: Secondary | ICD-10-CM

## 2024-05-22 DIAGNOSIS — R911 Solitary pulmonary nodule: Secondary | ICD-10-CM

## 2024-05-22 DIAGNOSIS — Z122 Encounter for screening for malignant neoplasm of respiratory organs: Secondary | ICD-10-CM

## 2024-05-22 DIAGNOSIS — F17210 Nicotine dependence, cigarettes, uncomplicated: Secondary | ICD-10-CM

## 2024-05-22 NOTE — Telephone Encounter (Addendum)
 Results have been reviewed by Ruthell, NP. Please review results below. Advise that although the radiologist suggest a 6 month follow up, the provider request a 3 month f/u scan due to nodule growth in 3 months. Previously 6.8 mm now 7.5 mm. Place order, due around 08/08/2024. Send results and plan to PCP.   IMPRESSION: Lung-RADS 3, probably benign findings. Index nodule is stable over a 3 month interval. Short-term follow-up in 6 months is recommended with repeat low-dose chest CT without contrast (please use the following order, CT CHEST LCS NODULE FOLLOW-UP W/O CM).   Extensive coronary artery calcification. Mild emphysema. Airway inflammation.   Aortic Atherosclerosis (ICD10-I70.0) and Emphysema (ICD10-J43.9).

## 2024-05-22 NOTE — Telephone Encounter (Signed)
 Spoke with patient and reviewed results. She is in agreement to complete a repeat scan in 3 months to evaluate nodule growth. Previously 6.8 now 7.5 mm. Order placed and CT scheduled for 08/08/2024. Results and plan to PCP.

## 2024-07-13 DIAGNOSIS — E559 Vitamin D deficiency, unspecified: Secondary | ICD-10-CM | POA: Diagnosis not present

## 2024-07-13 DIAGNOSIS — E038 Other specified hypothyroidism: Secondary | ICD-10-CM | POA: Diagnosis not present

## 2024-07-13 DIAGNOSIS — Z23 Encounter for immunization: Secondary | ICD-10-CM | POA: Diagnosis not present

## 2024-07-13 DIAGNOSIS — J449 Chronic obstructive pulmonary disease, unspecified: Secondary | ICD-10-CM | POA: Diagnosis not present

## 2024-07-13 DIAGNOSIS — E7800 Pure hypercholesterolemia, unspecified: Secondary | ICD-10-CM | POA: Diagnosis not present

## 2024-07-13 DIAGNOSIS — R09A2 Foreign body sensation, throat: Secondary | ICD-10-CM | POA: Diagnosis not present

## 2024-07-13 DIAGNOSIS — Z681 Body mass index (BMI) 19 or less, adult: Secondary | ICD-10-CM | POA: Diagnosis not present

## 2024-07-13 DIAGNOSIS — M810 Age-related osteoporosis without current pathological fracture: Secondary | ICD-10-CM | POA: Diagnosis not present

## 2024-07-13 DIAGNOSIS — I700 Atherosclerosis of aorta: Secondary | ICD-10-CM | POA: Diagnosis not present

## 2024-07-13 DIAGNOSIS — F5104 Psychophysiologic insomnia: Secondary | ICD-10-CM | POA: Diagnosis not present

## 2024-07-13 DIAGNOSIS — R202 Paresthesia of skin: Secondary | ICD-10-CM | POA: Diagnosis not present

## 2024-07-13 DIAGNOSIS — Z8673 Personal history of transient ischemic attack (TIA), and cerebral infarction without residual deficits: Secondary | ICD-10-CM | POA: Diagnosis not present

## 2024-07-17 ENCOUNTER — Other Ambulatory Visit (HOSPITAL_BASED_OUTPATIENT_CLINIC_OR_DEPARTMENT_OTHER): Payer: Self-pay | Admitting: Family Medicine

## 2024-07-17 DIAGNOSIS — M810 Age-related osteoporosis without current pathological fracture: Secondary | ICD-10-CM

## 2024-08-08 ENCOUNTER — Ambulatory Visit
Admission: RE | Admit: 2024-08-08 | Discharge: 2024-08-08 | Disposition: A | Discharge status: Home / Self Care | Source: Ambulatory Visit | Attending: Acute Care | Admitting: Acute Care

## 2024-08-08 DIAGNOSIS — F17210 Nicotine dependence, cigarettes, uncomplicated: Secondary | ICD-10-CM

## 2024-08-08 DIAGNOSIS — R911 Solitary pulmonary nodule: Secondary | ICD-10-CM | POA: Diagnosis not present

## 2024-08-08 DIAGNOSIS — Z87891 Personal history of nicotine dependence: Secondary | ICD-10-CM

## 2024-08-08 DIAGNOSIS — Z122 Encounter for screening for malignant neoplasm of respiratory organs: Secondary | ICD-10-CM

## 2024-08-13 ENCOUNTER — Telehealth: Payer: Self-pay | Admitting: *Deleted

## 2024-08-13 NOTE — Telephone Encounter (Signed)
 IMPRESSION: Gradually enlarging left apical spiculated nodule concerning for malignancy until proven otherwise. Lung-RADS 4A, suspicious. Follow up low-dose chest CT without contrast in 3 months (please use the following order, CT CHEST LCS NODULE FOLLOW-UP W/O CM) is recommended. Alternatively, PET may be considered when there is a solid component 8mm or larger.   Atherosclerotic calcifications of coronary arteries.   Stable left adrenal lipid rich adenoma which does not require imaging follow-up.  Lauraine Lites, NP has reviewed CT scan. Recommendations are to repeat CT in 3 months.

## 2024-08-14 ENCOUNTER — Other Ambulatory Visit: Payer: Self-pay

## 2024-08-14 DIAGNOSIS — Z122 Encounter for screening for malignant neoplasm of respiratory organs: Secondary | ICD-10-CM

## 2024-08-14 DIAGNOSIS — Z87891 Personal history of nicotine dependence: Secondary | ICD-10-CM

## 2024-08-14 DIAGNOSIS — R911 Solitary pulmonary nodule: Secondary | ICD-10-CM

## 2024-08-14 NOTE — Telephone Encounter (Signed)
 Spoke with patient and reviewed recent Lung CT results. She is in agreement to complete a 3 month follow up due to nodule growth. She has been scheduled for 11/09/2024, order placed. Results and plan to PCP.

## 2024-09-25 ENCOUNTER — Other Ambulatory Visit: Payer: Self-pay | Admitting: Neurology

## 2024-09-26 NOTE — Telephone Encounter (Signed)
 Last seen on 02/13/24 Follow up scheduled on 11/20/24

## 2024-11-07 ENCOUNTER — Telehealth: Payer: Self-pay | Admitting: Acute Care

## 2024-11-07 NOTE — Telephone Encounter (Signed)
 Copied from CRM #[id number]. Topic: Clinical - Request for Lab/Test Order >> Nov 07, 2024  9:21 AM Isabell A wrote: Reason for CRM: Consuelo from [facility] states patient is scheduled for Friday - pt had [employer] and pt is no longer active with insurance - requesting new insurance information with new authorization. Consuelo attempt to reach out to patient but left a voicemail.    Callback number: [phone] >> Nov 07, 2024  9:30 AM Corean SAUNDERS wrote: Consuelo with [facility] states patient called her and advised she no longer has insurance and would like to cancel the appointment and the authorization request.   LVM for patient to call with new insurance information so we can reschedule her CT scan

## 2024-11-09 ENCOUNTER — Ambulatory Visit

## 2024-11-19 NOTE — Telephone Encounter (Signed)
 Lung Cancer screening team is handling this referral

## 2024-11-20 ENCOUNTER — Telehealth: Payer: Self-pay

## 2024-11-20 ENCOUNTER — Ambulatory Visit: Payer: Self-pay | Admitting: Neurology

## 2024-11-20 NOTE — Telephone Encounter (Signed)
 LVM to schedule 3 month follow up scan.

## 2024-11-21 ENCOUNTER — Other Ambulatory Visit: Payer: Self-pay | Admitting: Neurology

## 2024-11-23 NOTE — Telephone Encounter (Signed)
 LVM sooner f/u visit with NP or MD  needed for further refills
